# Patient Record
Sex: Female | Born: 2000
Health system: Southern US, Community
[De-identification: ages and names within clinical notes are randomized; demographics above are authoritative.]

## PROBLEM LIST (undated history)

## (undated) DIAGNOSIS — N80129 Deep endometriosis of ovary, unspecified ovary: Secondary | ICD-10-CM

## (undated) DIAGNOSIS — Z23 Encounter for immunization: Secondary | ICD-10-CM

## (undated) DIAGNOSIS — Z789 Other specified health status: Secondary | ICD-10-CM

## (undated) HISTORY — DX: Deep endometriosis of ovary, unspecified ovary: N80.129

## (undated) HISTORY — PX: NO PAST SURGERIES: SHX2092

## (undated) HISTORY — DX: Other specified health status: Z78.9

## (undated) HISTORY — DX: Encounter for immunization: Z23

---
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
## 2019-03-07 DIAGNOSIS — Z23 Encounter for immunization: Secondary | ICD-10-CM | POA: Diagnosis not present

## 2019-03-07 DIAGNOSIS — Z0000 Encounter for general adult medical examination without abnormal findings: Secondary | ICD-10-CM | POA: Diagnosis not present

## 2019-08-15 DIAGNOSIS — N946 Dysmenorrhea, unspecified: Secondary | ICD-10-CM | POA: Diagnosis not present

## 2019-08-19 DIAGNOSIS — N946 Dysmenorrhea, unspecified: Secondary | ICD-10-CM | POA: Diagnosis not present

## 2019-09-14 ENCOUNTER — Ambulatory Visit: Payer: Self-pay | Attending: Internal Medicine

## 2019-09-14 DIAGNOSIS — Z23 Encounter for immunization: Secondary | ICD-10-CM

## 2019-10-11 ENCOUNTER — Ambulatory Visit: Payer: Self-pay | Attending: Internal Medicine

## 2020-02-24 DIAGNOSIS — Z20822 Contact with and (suspected) exposure to covid-19: Secondary | ICD-10-CM | POA: Diagnosis not present

## 2020-03-02 DIAGNOSIS — Z20822 Contact with and (suspected) exposure to covid-19: Secondary | ICD-10-CM | POA: Diagnosis not present

## 2020-06-22 ENCOUNTER — Encounter: Payer: Self-pay | Admitting: Emergency Medicine

## 2020-06-22 ENCOUNTER — Emergency Department
Admission: EM | Admit: 2020-06-22 | Discharge: 2020-06-22 | Disposition: A | Discharge status: Home / Self Care | Payer: 59 | Attending: Emergency Medicine | Admitting: Emergency Medicine

## 2020-06-22 ENCOUNTER — Emergency Department: Payer: 59

## 2020-06-22 ENCOUNTER — Other Ambulatory Visit: Payer: Self-pay

## 2020-06-22 DIAGNOSIS — R102 Pelvic and perineal pain: Secondary | ICD-10-CM

## 2020-06-22 DIAGNOSIS — R110 Nausea: Secondary | ICD-10-CM | POA: Diagnosis not present

## 2020-06-22 DIAGNOSIS — N809 Endometriosis, unspecified: Secondary | ICD-10-CM | POA: Diagnosis not present

## 2020-06-22 DIAGNOSIS — N838 Other noninflammatory disorders of ovary, fallopian tube and broad ligament: Secondary | ICD-10-CM | POA: Diagnosis not present

## 2020-06-22 DIAGNOSIS — R1030 Lower abdominal pain, unspecified: Secondary | ICD-10-CM | POA: Diagnosis present

## 2020-06-22 DIAGNOSIS — N80129 Deep endometriosis of ovary, unspecified ovary: Secondary | ICD-10-CM

## 2020-06-22 LAB — URINALYSIS, COMPLETE (UACMP) WITH MICROSCOPIC
Bacteria, UA: NONE SEEN
Bilirubin Urine: NEGATIVE
Glucose, UA: NEGATIVE mg/dL
Hgb urine dipstick: NEGATIVE
Ketones, ur: 5 mg/dL — AB
Leukocytes,Ua: NEGATIVE
Nitrite: NEGATIVE
Protein, ur: NEGATIVE mg/dL
Specific Gravity, Urine: 1.023 (ref 1.005–1.030)
WBC, UA: NONE SEEN WBC/hpf (ref 0–5)
pH: 7 (ref 5.0–8.0)

## 2020-06-22 LAB — COMPREHENSIVE METABOLIC PANEL
ALT: 15 U/L (ref 0–44)
AST: 19 U/L (ref 15–41)
Albumin: 4.7 g/dL (ref 3.5–5.0)
Alkaline Phosphatase: 57 U/L (ref 38–126)
Anion gap: 8 (ref 5–15)
BUN: 13 mg/dL (ref 6–20)
CO2: 24 mmol/L (ref 22–32)
Calcium: 9.2 mg/dL (ref 8.9–10.3)
Chloride: 103 mmol/L (ref 98–111)
Creatinine, Ser: 0.68 mg/dL (ref 0.44–1.00)
GFR, Estimated: >60 mL/min (ref >60)
Glucose, Bld: 87 mg/dL (ref 70–99)
Potassium: 3.5 mmol/L (ref 3.5–5.1)
Sodium: 135 mmol/L (ref 135–145)
Total Bilirubin: 0.8 mg/dL (ref 0.3–1.2)
Total Protein: 8.7 g/dL — ABNORMAL HIGH (ref 6.5–8.1)

## 2020-06-22 LAB — LIPASE, BLOOD: Lipase: 27 U/L (ref 11–51)

## 2020-06-22 LAB — CBC
HCT: 39.3 % (ref 36.0–46.0)
Hemoglobin: 13.8 g/dL (ref 12.0–15.0)
MCH: 30.7 pg (ref 26.0–34.0)
MCHC: 35.1 g/dL (ref 30.0–36.0)
MCV: 87.5 fL (ref 80.0–100.0)
Platelets: 276 10*3/uL (ref 150–400)
RBC: 4.49 MIL/uL (ref 3.87–5.11)
RDW: 10.9 % — ABNORMAL LOW (ref 11.5–15.5)
WBC: 10.2 10*3/uL (ref 4.0–10.5)
nRBC: 0 % (ref 0.0–0.2)

## 2020-06-22 LAB — POC URINE PREG, ED: Preg Test, Ur: NEGATIVE

## 2020-06-22 MED ORDER — SODIUM CHLORIDE 0.9 % IV BOLUS
1000.0000 mL | Freq: Once | INTRAVENOUS | Status: AC
  Administered 2020-06-22: 1000 mL via INTRAVENOUS

## 2020-06-22 MED ORDER — HYDROCODONE-ACETAMINOPHEN 5-325 MG PO TABS
1.0000 | ORAL_TABLET | ORAL | 0 refills | Status: DC | PRN
Start: 2020-06-22 — End: 2020-11-06

## 2020-06-22 MED ORDER — IBUPROFEN 600 MG PO TABS: 600.0000 mg | ORAL_TABLET | Freq: Three times a day (TID) | ORAL | 0 refills | Status: DC | PRN

## 2020-06-22 MED ORDER — KETOROLAC TROMETHAMINE 30 MG/ML IJ SOLN
15.0000 mg | Freq: Once | INTRAMUSCULAR | Status: AC
  Administered 2020-06-22: 15 mg via INTRAVENOUS
  Filled 2020-06-22: qty 1

## 2020-06-22 MED ORDER — ONDANSETRON 4 MG PO TBDP
4.0000 mg | ORAL_TABLET | Freq: Three times a day (TID) | ORAL | 0 refills | Status: DC | PRN
Start: 2020-06-22 — End: 2020-11-06

## 2020-06-22 NOTE — ED Provider Notes (Signed)
[facility] EMERGENCY DEPARTMENT Provider Note   CSN: [id number] Arrival date & time: 06/22/20  1259     History Chief Complaint  Patient presents with  . Abdominal Pain    Leslie Russell is a 20 y.o. female.  HPI    20 yo F here with abdominal pain. Pt reports acute onset of severe lower abdominal pain this afternoon around 12. She had been doing well prior to this. Pain began fairly acutely 5-6/10 with severe, suprapubic pain. She had associated nausea. No vomiting. Pain felt similar to her menstrual cramps but more severe. She just finished her period last week. Reports h/o painful periods, no known h/o cysts. No h/o fibroids. She is not sexually active. Denies any urinary sx. No constipation or diarrhea. No fevers. No vaginal discharge. Pain slowly improving now, but was up to 10/10 in severity. No alleviating factors in particular.    History reviewed. No pertinent past medical history.  There are no problems to display for this patient.   History reviewed. No pertinent surgical history.   OB History   No obstetric history on file.     No family history on file.  Social History   Tobacco Use  . Smoking status: Never Smoker  . Smokeless tobacco: Never Used  Substance Use Topics  . Alcohol use: Not Currently  . Drug use: Not Currently    Home Medications Prior to Admission medications   Medication Sig Start Date End Date Taking? Authorizing Provider  HYDROcodone-acetaminophen (NORCO/VICODIN) 5-325 MG tablet Take 1 tablet by mouth every 4 (four) hours as needed for severe pain. 06/22/20 06/22/21 Yes Shaune Pollack, MD  ibuprofen (ADVIL) 600 MG tablet Take 1 tablet (600 mg total) by mouth every 8 (eight) hours as needed for moderate pain. 06/22/20  Yes Shaune Pollack, MD  ondansetron (ZOFRAN ODT) 4 MG disintegrating tablet Take 1 tablet (4 mg total) by mouth every 8 (eight) hours as needed for nausea or vomiting. 06/22/20  Yes Shaune Pollack, MD     Allergies    Patient has no known allergies.  Review of Systems   Review of Systems  Constitutional: Negative for fatigue and fever.  HENT: Negative for congestion and sore throat.   Eyes: Negative for visual disturbance.  Respiratory: Negative for cough and shortness of breath.   Cardiovascular: Negative for chest pain.  Gastrointestinal: Positive for abdominal pain. Negative for diarrhea, nausea and vomiting.  Genitourinary: Positive for pelvic pain. Negative for flank pain.  Musculoskeletal: Negative for back pain and neck pain.  Skin: Negative for rash and wound.  Neurological: Negative for weakness.  All other systems reviewed and are negative.   Physical Exam Updated Vital Signs BP 117/69   Pulse 77   Temp 98.6 F (37 C) (Oral)   Resp 14   Ht 5\' 5"  (1.651 m)   Wt 54.4 kg   SpO2 100%   BMI 19.97 kg/m   Physical Exam Vitals and nursing note reviewed.  Constitutional:      General: She is not in acute distress.    Appearance: She is well-developed.  HENT:     Head: Normocephalic and atraumatic.  Eyes:     Conjunctiva/sclera: Conjunctivae normal.  Cardiovascular:     Rate and Rhythm: Normal rate and regular rhythm.     Heart sounds: Normal heart sounds. No murmur heard. No friction rub.  Pulmonary:     Effort: Pulmonary effort is normal. No respiratory distress.     Breath sounds:  Normal breath sounds. No wheezing or rales.  Abdominal:     General: There is no distension.     Palpations: Abdomen is soft.     Tenderness: There is abdominal tenderness in the suprapubic area. There is no guarding or rebound.  Musculoskeletal:     Cervical back: Neck supple.  Skin:    General: Skin is warm.     Capillary Refill: Capillary refill takes less than 2 seconds.  Neurological:     Mental Status: She is alert and oriented to person, place, and time.     Motor: No abnormal muscle tone.     ED Results / Procedures / Treatments   Labs (all labs ordered are  listed, but only abnormal results are displayed) Labs Reviewed  COMPREHENSIVE METABOLIC PANEL - Abnormal; Notable for the following components:      Result Value   Total Protein 8.7 (*)    All other components within normal limits  CBC - Abnormal; Notable for the following components:   RDW 10.9 (*)    All other components within normal limits  URINALYSIS, COMPLETE (UACMP) WITH MICROSCOPIC - Abnormal; Notable for the following components:   Color, Urine YELLOW (*)    APPearance CLEAR (*)    Ketones, ur 5 (*)    All other components within normal limits  LIPASE, BLOOD  POC URINE PREG, ED    EKG None  Radiology [place] 4.4 cm nonvascular mass in the right ovary consistent with endometrioma, otherwise no evidence of torsion or other abnormality  Procedures Procedures   Medications Ordered in ED Medications  sodium chloride 0.9 % bolus 1,000 mL (0 mLs Intravenous Stopped 06/22/20 1709)  ketorolac (TORADOL) 30 MG/ML injection 15 mg (15 mg Intravenous Given 06/22/20 1525)    ED Course  I have reviewed the triage vital signs and the nursing notes.  Pertinent labs & imaging results that were available during my care of the patient were reviewed by me and considered in my medical decision making (see chart for details).    MDM Rules/Calculators/A&P                          20 year old female here with acute onset pelvic pain, now largely resolved.  Patient has a history of recurrent painful menses and clinically, story is concerning for possible endometriosis.  Interestingly, ultrasound does show an endometrioma in the right ovary.  No evidence of torsion.  Her pain is resolved.  Otherwise, she is not sexually active, do not suspect infection.  Urinalysis without signs of UTI.  No leukocytosis.  Electrolytes normal.  Urine pregnancy negative.  Will start her on NSAIDs and refer her for OB follow-up.  Given brief course of analgesia as needed.  Return precautions given.  Final Clinical  Impression(s) / ED Diagnoses Final diagnoses:  Pelvic pain  Endometrioma    Rx / DC Orders ED Discharge Orders         Ordered    ibuprofen (ADVIL) 600 MG tablet  Every 8 hours PRN        06/22/20 1856    HYDROcodone-acetaminophen (NORCO/VICODIN) 5-325 MG tablet  Every 4 hours PRN        06/22/20 1856    ondansetron (ZOFRAN ODT) 4 MG disintegrating tablet  Every 8 hours PRN        06/22/20 1856           Shaune Pollack, MD 06/22/20 1908

## 2020-06-22 NOTE — ED Triage Notes (Signed)
Pt here with c/o lower abd pain that began today, states she has very painful periods, had a period 2 weeks ago, tearful and breathing heavy in triage, states no chance of being pregnant, is a virgin. Pain is spasm in nature. NAD.

## 2020-07-13 ENCOUNTER — Ambulatory Visit (INDEPENDENT_AMBULATORY_CARE_PROVIDER_SITE_OTHER): Payer: 59 | Admitting: Obstetrics and Gynecology

## 2020-07-13 ENCOUNTER — Encounter: Payer: Self-pay | Admitting: Obstetrics and Gynecology

## 2020-07-13 ENCOUNTER — Other Ambulatory Visit: Payer: Self-pay

## 2020-07-13 ENCOUNTER — Other Ambulatory Visit (HOSPITAL_COMMUNITY)
Admission: RE | Admit: 2020-07-13 | Discharge: 2020-07-13 | Disposition: A | Discharge status: Home / Self Care | Payer: 59 | Source: Ambulatory Visit | Attending: Obstetrics and Gynecology | Admitting: Obstetrics and Gynecology

## 2020-07-13 VITALS — BP 100/74 | Ht 65.5 in | Wt 126.0 lb

## 2020-07-13 DIAGNOSIS — Z113 Encounter for screening for infections with a predominantly sexual mode of transmission: Secondary | ICD-10-CM | POA: Diagnosis not present

## 2020-07-13 DIAGNOSIS — N83201 Unspecified ovarian cyst, right side: Secondary | ICD-10-CM | POA: Diagnosis not present

## 2020-07-13 NOTE — Progress Notes (Signed)
Obstetrics & Gynecology Office Visit   Chief Complaint  Patient presents with  . Follow-up    ER ovary  ER follow up for ovarian cyst  History of Present Illness: 20 y.o. G0P0000 female who presents in follow up from an ER visit on 06/22/2020.  She had acute-onset abdominal pain in her lower abdomen.  She rates the pain score at that time as 10/10.  The pain did not radiate.  She describes the pain as tight, bad cramps.  Alleviating factors: none.  Aggravating factors: none.  Associated symptoms: denies. She specifically denies fevers, chills, nausea, vomiting, diarrhea. She has a period that comes monthly, last about 1 week. She has no intermenstrual bleeding.  She normally has painful periods to the point that she is only able to be in bed getting over the pain.  Usually the worst pain is during the first two days. After that the pain is more manageable.  This episode was different in that she usually doesn't have significant pain outside of her periods.  She is having no pain today.  She states that she doesn't have traditional symptoms. She is cautious about what she eats because this makes her pain worse.    Ultrasound report from 06/22/2020: FINDINGS: Uterus  Measurements: 7.3 x 2.8 x 4.5 cm = volume: 49 mL. No fibroids or other mass visualized.  Endometrium  Thickness: 5.1 mm.  No focal abnormality visualized.  Right ovary  Measurements: 5.3 x 4.1 x 4.8 cm = volume: 55 mL. There is a rounded mass closely associated with the right ovary that does not contain significant internal color Doppler flow. This measures 4.4 x 3.7 x 3.6 cm and contains low level internal echoes.  Left ovary  Measurements: 2.9 x 1.4 x 1.6 cm = volume: 3 mL. Normal appearance/no adnexal mass.  Pulsed Doppler evaluation of both ovaries demonstrates normal low-resistance arterial and venous waveforms.  Other findings  There is a small volume of pelvic free fluid.  IMPRESSION: 1. No  definite sonographic evidence for ovarian torsion. 2. There is a 4.4 cm nonvascular mass involving the right ovary with low level internal echoes. This is favored to represent an endometrioma. A follow-up ultrasound is recommended in 6-12 weeks.  Past Medical History:  Diagnosis Date  . No known health problems   . No pertinent past medical history     Past Surgical History:  Procedure Laterality Date  . NO PAST SURGERIES      Gynecologic History: Patient's last menstrual period was 07/09/2020 (exact date).  Obstetric History: G0P0000   Family History  Problem Relation Age of Onset  . Prostate cancer Maternal Grandfather   . Cancer - Colon Paternal Grandfather   . Endometriosis Neg Hx     Social History   Socioeconomic History  . Marital status: Single    Spouse name: Not on file  . Number of children: Not on file  . Years of education: Not on file  . Highest education level: Not on file  Occupational History  . Not on file  Tobacco Use  . Smoking status: Never Smoker  . Smokeless tobacco: Never Used  Vaping Use  . Vaping Use: Never used  Substance and Sexual Activity  . Alcohol use: Not Currently  . Drug use: Not Currently  . Sexual activity: Never    Birth control/protection: None  Other Topics Concern  . Not on file  Social History Narrative  . Not on file   Social Determinants of Health  Financial Resource Strain: Not on file  Food Insecurity: Not on file  Transportation Needs: Not on file  Physical Activity: Not on file  Stress: Not on file  Social Connections: Not on file  Intimate Partner Violence: Not on file   Allergies: No Known Allergies  Prior to Admission medications: Denies     Review of Systems  Constitutional: Negative.   HENT: Negative.   Eyes: Negative.   Respiratory: Negative.   Cardiovascular: Negative.   Gastrointestinal: Negative.   Genitourinary: Negative.   Musculoskeletal: Negative.   Skin: Negative.    Neurological: Negative.   Psychiatric/Behavioral: Negative.      Physical Exam BP 100/74   Ht 5' 5.5" (1.664 m)   Wt 126 lb (57.2 kg)   LMP 07/09/2020 (Exact Date)   BMI 20.65 kg/m  Patient's last menstrual period was 07/09/2020 (exact date). Physical Exam Constitutional:      General: She is not in acute distress.    Appearance: Normal appearance. She is well-developed.  HENT:     Head: Normocephalic and atraumatic.  Eyes:     General: No scleral icterus.    Conjunctiva/sclera: Conjunctivae normal.  Cardiovascular:     Rate and Rhythm: Normal rate and regular rhythm.     Heart sounds: No murmur heard. No friction rub. No gallop.   Pulmonary:     Effort: Pulmonary effort is normal. No respiratory distress.     Breath sounds: Normal breath sounds. No wheezing or rales.  Abdominal:     General: Bowel sounds are normal. There is no distension.     Palpations: Abdomen is soft. There is no mass.     Tenderness: There is no abdominal tenderness. There is no guarding or rebound.  Musculoskeletal:        General: Normal range of motion.     Cervical back: Normal range of motion and neck supple.  Neurological:     General: No focal deficit present.     Mental Status: She is alert and oriented to person, place, and time.     Cranial Nerves: No cranial nerve deficit.  Skin:    General: Skin is warm and dry.     Findings: No erythema.  Psychiatric:        Mood and Affect: Mood normal.        Behavior: Behavior normal.        Judgment: Judgment normal.     Female chaperone present for pelvic and breast  portions of the physical exam  Assessment: 20 y.o. No obstetric history on file. female here for  1. Right ovarian cyst   2. Screen for STD (sexually transmitted disease)      Plan: Problem List Items Addressed This Visit   None   Visit Diagnoses    Right ovarian cyst    -  Primary   Relevant Orders   US PELVIS (TRANSABDOMINAL ONLY)   Cervicovaginal ancillary only    Screen for STD (sexually transmitted disease)       Relevant Orders   Cervicovaginal ancillary only     She was present with her mother today.  We discussed that the finding on ultrasound appeared to be an endometrioma.  Another possibility would be a hemorrhagic ovarian cyst.  We discussed the implications of an endometrioma and potential management strategies, including: watchful waiting, symptomatic treatment with NSAIDs and possibly progesterone-based hormonal management.  She also indicated that she might like to try other, more natural remedies.  We discussed obtaining a  follow up ultrasound in a couple of months. If the cyst is still present with the same characteristics, then this would tell is that it is likely an endometrioma instead of a hemorrhagic ovarian cyst. She agreed to this plan and understands that she may contact me any time to discuss further or alter the plan.  All questions answered today.  She states that she is virginal and so we will perform a transabdominal ultrasound, though she agreed to STI screening today.   A total of 30 minutes were spent face-to-face with the patient as well as preparation, review, communication, and documentation during this encounter.    Thomasene Mohair, MD 07/13/2020 2:49 PM

## 2020-07-14 ENCOUNTER — Encounter: Payer: Self-pay | Admitting: Obstetrics and Gynecology

## 2020-07-17 LAB — CERVICOVAGINAL ANCILLARY ONLY
Chlamydia: NEGATIVE
Comment: NEGATIVE
Comment: NORMAL
Neisseria Gonorrhea: NEGATIVE

## 2020-08-08 ENCOUNTER — Other Ambulatory Visit: Payer: Self-pay | Admitting: Obstetrics and Gynecology

## 2020-08-08 DIAGNOSIS — N83201 Unspecified ovarian cyst, right side: Secondary | ICD-10-CM

## 2020-09-06 DIAGNOSIS — L259 Unspecified contact dermatitis, unspecified cause: Secondary | ICD-10-CM | POA: Diagnosis not present

## 2020-09-06 DIAGNOSIS — L0100 Impetigo, unspecified: Secondary | ICD-10-CM | POA: Diagnosis not present

## 2020-09-06 DIAGNOSIS — Z113 Encounter for screening for infections with a predominantly sexual mode of transmission: Secondary | ICD-10-CM | POA: Diagnosis not present

## 2020-09-06 DIAGNOSIS — Z0000 Encounter for general adult medical examination without abnormal findings: Secondary | ICD-10-CM | POA: Diagnosis not present

## 2020-09-12 ENCOUNTER — Ambulatory Visit
Admission: RE | Admit: 2020-09-12 | Discharge: 2020-09-12 | Disposition: A | Discharge status: Home / Self Care | Payer: 59 | Source: Ambulatory Visit | Attending: Obstetrics and Gynecology | Admitting: Obstetrics and Gynecology

## 2020-09-12 ENCOUNTER — Other Ambulatory Visit: Payer: Self-pay | Admitting: Obstetrics and Gynecology

## 2020-09-12 ENCOUNTER — Ambulatory Visit: Payer: 59 | Admitting: Obstetrics and Gynecology

## 2020-09-12 ENCOUNTER — Ambulatory Visit: Payer: 59

## 2020-09-12 ENCOUNTER — Other Ambulatory Visit: Payer: Self-pay

## 2020-09-12 DIAGNOSIS — N83201 Unspecified ovarian cyst, right side: Secondary | ICD-10-CM

## 2020-09-12 DIAGNOSIS — N83291 Other ovarian cyst, right side: Secondary | ICD-10-CM | POA: Diagnosis not present

## 2020-09-12 DIAGNOSIS — N838 Other noninflammatory disorders of ovary, fallopian tube and broad ligament: Secondary | ICD-10-CM | POA: Diagnosis not present

## 2020-09-17 ENCOUNTER — Ambulatory Visit: Payer: 59 | Admitting: Obstetrics and Gynecology

## 2020-09-17 ENCOUNTER — Other Ambulatory Visit: Payer: Self-pay

## 2020-09-17 ENCOUNTER — Encounter: Payer: Self-pay | Admitting: Obstetrics and Gynecology

## 2020-09-17 VITALS — BP 118/74 | Ht 65.0 in | Wt 130.0 lb

## 2020-09-17 DIAGNOSIS — N83201 Unspecified ovarian cyst, right side: Secondary | ICD-10-CM

## 2020-09-17 DIAGNOSIS — R102 Pelvic and perineal pain: Secondary | ICD-10-CM

## 2020-09-17 NOTE — Progress Notes (Signed)
Gynecology Ultrasound Follow Up  Chief Complaint:  Chief Complaint  Patient presents with  . Follow-up  Pelvic ultrasound on 09/12/2020 for pelvic pain and history of right ovarian cyst with concern for endometrioma.   History of Present Illness: Patient is a 20 y.o. female who presents today for ultrasound evaluation of the above .  Ultrasound demonstrates the following findings Adnexa: right ovary with complex right ovarian cyst, measurig 4.4 cm, consistent with likely endometrioma. Normal appearing left ovary Uterus: with endometrial stripe  4.9 mm Additional: normal ultrasound apart from right ovary.   She states that her pain is improving. She she is changing dietary habits and if she keeps up with that, the pain is not as bad.   Past Medical History:  Diagnosis Date  . No known health problems   . No pertinent past medical history     Past Surgical History:  Procedure Laterality Date  . NO PAST SURGERIES      Family History  Problem Relation Age of Onset  . Prostate cancer Maternal Grandfather   . Cancer - Colon Paternal Grandfather   . Endometriosis Neg Hx     Social History   Socioeconomic History  . Marital status: Single    Spouse name: Not on file  . Number of children: Not on file  . Years of education: Not on file  . Highest education level: Not on file  Occupational History  . Not on file  Tobacco Use  . Smoking status: Never Smoker  . Smokeless tobacco: Never Used  Vaping Use  . Vaping Use: Never used  Substance and Sexual Activity  . Alcohol use: Not Currently  . Drug use: Not Currently  . Sexual activity: Never    Birth control/protection: None  Other Topics Concern  . Not on file  Social History Narrative  . Not on file   Social Determinants of Health   Financial Resource Strain: Not on file  Food Insecurity: Not on file  Transportation Needs: Not on file  Physical Activity: Not on file  Stress: Not on file  Social Connections:  Not on file  Intimate Partner Violence: Not on file    No Known Allergies  Prior to Admission medications   Medication Sig Start Date End Date Taking? Authorizing Provider  HYDROcodone-acetaminophen (NORCO/VICODIN) 5-325 MG tablet Take 1 tablet by mouth every 4 (four) hours as needed for severe pain. 06/22/20 06/22/21  Shaune Pollack, MD  ibuprofen (ADVIL) 600 MG tablet Take 1 tablet (600 mg total) by mouth every 8 (eight) hours as needed for moderate pain. 06/22/20   Shaune Pollack, MD  ondansetron (ZOFRAN ODT) 4 MG disintegrating tablet Take 1 tablet (4 mg total) by mouth every 8 (eight) hours as needed for nausea or vomiting. Patient not taking: Reported on 07/13/2020 06/22/20   Shaune Pollack, MD    Physical Exam BP 118/74   Ht 5\' 5"  (1.651 m)   Wt 130 lb (59 kg)   BMI 21.63 kg/m    General: NAD HEENT: normocephalic, anicteric Pulmonary: No increased work of breathing Extremities: no edema, erythema, or tenderness Neurologic: Grossly intact, normal gait Psychiatric: mood appropriate, affect full  Imaging Results See report  Assessment: 20 y.o. G0P0000  1. Right ovarian cyst   2. Pelvic pain      Plan: Problem List Items Addressed This Visit   None   Visit Diagnoses    Right ovarian cyst    -  Primary   Relevant Orders  US PELVIS TRANSVAGINAL NON-OB (TV ONLY)   Pelvic pain       Relevant Orders   US PELVIS TRANSVAGINAL NON-OB (TV ONLY)     We discussed findings of likely endometrioma in setting of her symptoms. My recommendation is for removal with investigation of endometriosis, as it is likely the source of her symptoms. She will consider. We discussed the diagnosis, treatment, and natural history of the disease in great detail.  She will consider her options and let me know how she would like to proceed. At a minimum, I recommended that we follow up the cyst in 6 months to assess interval growth.  She voiced understanding.  We discussed that the cyst could grow,  new cysts could form, if we do not at least attempt suppression of endometriosis. We discussed pregnancy implications. We discussed small association of this cyst with clear cell cancer of the ovary.  All questions answered.   A total of 24 minutes were spent face-to-face with the patient as well as preparation, review, communication, and documentation during this encounter.    Thomasene Mohair, MD, [facility], [facility] 09/17/2020 1:50 PM

## 2020-10-02 ENCOUNTER — Telehealth: Payer: Self-pay

## 2020-10-02 NOTE — Telephone Encounter (Signed)
-----   Message from Conard Novak, MD sent at 09/18/2020  3:10 PM EDT ----- Regarding: Schedule surgery Surgery Booking Request Patient Full Name:  Leslie Russell  MRN: [id number]  DOB: Apr 23, 2001  Surgeon: Thomasene Mohair, MD  Requested Surgery Date and Time: TBD (after 10/18/2020) Primary Diagnosis AND Code:  1) Right ovarian cyst [N83.201] 2) Pelvic pain [R10.2] Secondary Diagnosis and Code:  Surgical Procedure: Robot assisted laparoscopic right ovarian cystectomy RNFA Requested?: Yes L&D Notification: No Admission Status: same day surgery Length of Surgery: 75 min Special Case Needs: Yes, [employer] forces technical officer Robot H&P: Yes Phone Interview???:  Yes Interpreter: No Medical Clearance:  No Special Scheduling Instructions: schedule for [employer] robot after 6/16.  Any known health/anesthesia issues, diabetes, sleep apnea, latex allergy, defibrillator/pacemaker?: No Acuity: P3   (P1 highest, P2 delay may cause harm, P3 low, elective gyn, P4 lowest)

## 2020-10-02 NOTE — Telephone Encounter (Signed)
Called patient to schedule Robot assisted laparoscopic right ovarian cystectomy w Britt Boozer 7/5  H&P 6/17 @ 2:10   Pre-admit phone call appointment to be requested - date and time will be included on H&P paper work. Also all appointments will be updated on pt MyChart. Explained that this appointment has a call window. Based on the time scheduled will indicate if the call will be received within a 4 hour window before 1:00 or after.  Advised that pt may also receive calls from the hospital pharmacy and pre-service center.  Confirmed pt has [employer] as primary insurance. No secondary insurance.

## 2020-10-17 NOTE — Telephone Encounter (Signed)
Patient called in to request that her H&P be changed to a different day.   H&P 6/29 @ 3:10

## 2020-10-19 ENCOUNTER — Encounter: Payer: 59 | Admitting: Obstetrics and Gynecology

## 2020-10-30 ENCOUNTER — Other Ambulatory Visit: Payer: Self-pay

## 2020-10-30 ENCOUNTER — Encounter
Admission: RE | Admit: 2020-10-30 | Discharge: 2020-10-30 | Disposition: A | Discharge status: Home / Self Care | Payer: 59 | Source: Ambulatory Visit | Attending: Obstetrics and Gynecology | Admitting: Obstetrics and Gynecology

## 2020-10-30 NOTE — Patient Instructions (Addendum)
Your procedure is scheduled on: November 06, 2020 TUESDAY Report to the Registration Desk on the 1st floor of the [facility] To find out your arrival time, please call [phone] between 1PM - 3PM on: Friday November 02, 2020  REMEMBER: Instructions that are not followed completely may result in serious medical risk, up to and including death; or upon the discretion of your surgeon and anesthesiologist your surgery may need to be rescheduled.  Do not eat food after midnight the night before surgery.  No gum chewing, lozengers or hard candies.  You may however, drink CLEAR liquids up to 2 hours before you are scheduled to arrive for your surgery. Do not drink anything within 2 hours of your scheduled arrival time.  Clear liquids include: - water  - apple juice without pulp - gatorade (not RED, PURPLE, OR BLUE) - black coffee or tea (Do NOT add milk or creamers to the coffee or tea) Do NOT drink anything that is not on this list.  Type 1 and Type 2 diabetics should only drink water.  In addition, your doctor has ordered for you to drink the provided  Ensure Pre-Surgery Clear Carbohydrate Drink  Drinking this carbohydrate drink up to two hours before surgery helps to reduce insulin resistance and improve patient outcomes. Please complete drinking 2 hours prior to scheduled arrival time.  TAKE THESE MEDICATIONS THE MORNING OF SURGERY WITH A SIP OF WATER: NONE   One week prior to surgery: Stop Anti-inflammatories (NSAIDS) such as Advil, Aleve, Ibuprofen, Motrin, Naproxen, Naprosyn and ASPIRIN OR Aspirin based products such as Excedrin, Goodys Powder, BC Powder. Stop ANY OVER THE COUNTER supplements until after surgery. You may however, continue to take Tylenol if needed for pain up until the day of surgery.  No Alcohol for 24 hours before or after surgery.  No Smoking including e-cigarettes for 24 hours prior to surgery.  No chewable tobacco products for at least 6 hours prior to surgery.   No nicotine patches on the day of surgery.  Do not use any "recreational" drugs for at least a week prior to your surgery.  Please be advised that the combination of cocaine and anesthesia may have negative outcomes, up to and including death. If you test positive for cocaine, your surgery will be cancelled.  On the morning of surgery brush your teeth with toothpaste and water, you may rinse your mouth with mouthwash if you wish. Do not swallow any toothpaste or mouthwash.  Do not wear jewelry, make-up, hairpins, clips or nail polish.  Do not wear lotions, powders, or perfumes OR DEODORANT  Do not shave body from the neck down 48 hours prior to surgery just in case you cut yourself which could leave a site for infection.  Also, freshly shaved skin may become irritated if using the CHG soap.  Contact lenses, hearing aids and dentures may not be worn into surgery.  Do not bring valuables to the hospital. [facility] is not responsible for any missing/lost belongings or valuables.   Use CHG Soap as directed on instruction sheet.  Notify your doctor if there is any change in your medical condition (cold, fever, infection).  Wear comfortable clothing (specific to your surgery type) to the hospital.  After surgery, you can help prevent lung complications by doing breathing exercises.  Take deep breaths and cough every 1-2 hours. Your doctor may order a device called an Incentive Spirometer to help you take deep breaths. When coughing or sneezing, hold a pillow  firmly against your incision with both hands. This is called "splinting." Doing this helps protect your incision. It also decreases belly discomfort.  If you are being discharged the day of surgery, you will not be allowed to drive home. You will need a responsible adult (18 years or older) to drive you home and stay with you that night.   If you are taking public transportation, you will need to have a responsible adult (18 years  or older) with you. Please confirm with your physician that it is acceptable to use public transportation.   Please call the Pre-admissions Testing Dept. at [phone] if you have any questions about these instructions.  Surgery Visitation Policy:  Patients undergoing a surgery or procedure may have one family member or support person with them as long as that person is not COVID-19 positive or experiencing its symptoms.  That person may remain in the waiting area during the procedure.  Inpatient Visitation:    Visiting hours are 7 a.m. to 8 p.m. Inpatients will be allowed two visitors daily. The visitors may change each day during the patient's stay. No visitors under the age of 70. Any visitor under the age of 30 must be accompanied by an adult. The visitor must pass COVID-19 screenings, use hand sanitizer when entering and exiting the patient's room and wear a mask at all times, including in the patient's room. Patients must also wear a mask when staff or their visitor are in the room. Masking is required regardless of vaccination status.

## 2020-10-31 ENCOUNTER — Ambulatory Visit (INDEPENDENT_AMBULATORY_CARE_PROVIDER_SITE_OTHER): Payer: 59 | Admitting: Obstetrics and Gynecology

## 2020-10-31 ENCOUNTER — Encounter: Payer: Self-pay | Admitting: Obstetrics and Gynecology

## 2020-10-31 ENCOUNTER — Other Ambulatory Visit
Admission: RE | Admit: 2020-10-31 | Discharge: 2020-10-31 | Disposition: A | Discharge status: Home / Self Care | Payer: 59 | Source: Ambulatory Visit | Attending: Obstetrics and Gynecology | Admitting: Obstetrics and Gynecology

## 2020-10-31 VITALS — BP 118/68 | Ht 65.0 in | Wt 131.0 lb

## 2020-10-31 DIAGNOSIS — Z01812 Encounter for preprocedural laboratory examination: Secondary | ICD-10-CM | POA: Insufficient documentation

## 2020-10-31 DIAGNOSIS — N83201 Unspecified ovarian cyst, right side: Secondary | ICD-10-CM

## 2020-10-31 DIAGNOSIS — R102 Pelvic and perineal pain: Secondary | ICD-10-CM

## 2020-10-31 LAB — TYPE AND SCREEN
ABO/RH(D): O POS
Antibody Screen: NEGATIVE

## 2020-10-31 NOTE — H&P (View-Only) (Signed)
Preoperative History and Physical  Leslie Russell is a 20 y.o. G0P0000 here for surgical management of chronic pelvic pain and a persistent right ovarian cyst, likely consistent with an endometrioma.   No significant preoperative concerns.  History of Present Illness: 20 y.o. G71P0000 female who presents with acute-onset abdominal pain in her lower abdomen.  She rates the pain score at that time as 10/10.  The pain did not radiate.  She describes the pain as tight, bad cramps.  Alleviating factors: none.  Aggravating factors: none.  Associated symptoms: denies. She specifically denies fevers, chills, nausea, vomiting, diarrhea. She has a period that comes monthly, last about 1 week. She has no intermenstrual bleeding.  She normally has painful periods to the point that she is only able to be in bed getting over the pain.  Usually the worst pain is during the first two days. After that the pain is more manageable.  This episode was different in that she usually doesn't have significant pain outside of her periods.  She is having no pain today. She states that she doesn't have traditional symptoms. She is cautious about what she eats because this makes her pain worse.     Ultrasound report from 06/22/2020: FINDINGS: Uterus   Measurements: 7.3 x 2.8 x 4.5 cm = volume: 49 mL. No fibroids or other mass visualized.   Endometrium   Thickness: 5.1 mm.  No focal abnormality visualized.   Right ovary   Measurements: 5.3 x 4.1 x 4.8 cm = volume: 55 mL. There is a rounded mass closely associated with the right ovary that does not contain significant internal color Doppler flow. This measures 4.4 x 3.7 x 3.6 cm and contains low level internal echoes.   Left ovary   Measurements: 2.9 x 1.4 x 1.6 cm = volume: 3 mL. Normal appearance/no adnexal mass.   Pulsed Doppler evaluation of both ovaries demonstrates normal low-resistance arterial and venous waveforms.   Other findings   There is a small volume  of pelvic free fluid.   IMPRESSION: 1. No definite sonographic evidence for ovarian torsion. 2. There is a 4.4 cm nonvascular mass involving the right ovary with low level internal echoes. This is favored to represent an endometrioma. A follow-up ultrasound is recommended in 6-12 weeks.  Follow up ultrasound on 09/12/20 (report): Ultrasound demonstrates the following findings Adnexa: right ovary with complex right ovarian cyst, measurig 4.4 cm, consistent with likely endometrioma. Normal appearing left ovary Uterus: with endometrial stripe  4.9 mm Additional: normal ultrasound apart from right ovary.   Proposed surgery: robot assisted right ovarian cystectomy, diagnostic laparoscopy  Past Medical History:  Diagnosis Date   No known health problems    No pertinent past medical history    Past Surgical History:  Procedure Laterality Date   NO PAST SURGERIES     OB History  Gravida Para Term Preterm AB Living  0 0 0 0 0 0  SAB IAB Ectopic Multiple Live Births  0 0 0 0 0  Patient denies any other pertinent gynecologic issues.   Current Outpatient Medications on File Prior to Visit  Medication Sig Dispense Refill   HYDROcodone-acetaminophen (NORCO/VICODIN) 5-325 MG tablet Take 1 tablet by mouth every 4 (four) hours as needed for severe pain. (Patient not taking: Reported on 10/23/2020) 6 tablet 0   ibuprofen (ADVIL) 200 MG tablet Take 400-600 mg by mouth every 6 (six) hours as needed for headache or moderate pain.     ibuprofen (ADVIL) 600  MG tablet Take 1 tablet (600 mg total) by mouth every 8 (eight) hours as needed for moderate pain. (Patient not taking: Reported on 10/23/2020) 20 tablet 0   ondansetron (ZOFRAN ODT) 4 MG disintegrating tablet Take 1 tablet (4 mg total) by mouth every 8 (eight) hours as needed for nausea or vomiting. (Patient not taking: No sig reported) 12 tablet 0   OVER THE COUNTER MEDICATION Take 2 tablets by mouth daily as needed (cramping). Cramp Aid otc  supplement     No current facility-administered medications on file prior to visit.   No Known Allergies  Social History:   reports that she has never smoked. She has never used smokeless tobacco. She reports previous alcohol use. She reports previous drug use.  Family History  Problem Relation Age of Onset   Prostate cancer Maternal Grandfather    Cancer - Colon Paternal Grandfather    Endometriosis Neg Hx     Review of Systems: Noncontributory  PHYSICAL EXAM: Blood pressure 118/68, height 5' 5" (1.651 m), weight 131 lb (59.4 kg), last menstrual period 10/23/2020. CONSTITUTIONAL: Well-developed, well-nourished female in no acute distress.  HENT:  Normocephalic, atraumatic, External right and left ear normal. Oropharynx is clear and moist EYES: Conjunctivae and EOM are normal. Pupils are equal, round, and reactive to light. No scleral icterus.  NECK: Normal range of motion, supple, no masses SKIN: Skin is warm and dry. No rash noted. Not diaphoretic. No erythema. No pallor. NEUROLGIC: Alert and oriented to person, place, and time. Normal reflexes, muscle tone coordination. No cranial nerve deficit noted. PSYCHIATRIC: Normal mood and affect. Normal behavior. Normal judgment and thought content. CARDIOVASCULAR: Normal heart rate noted, regular rhythm RESPIRATORY: Effort and breath sounds normal, no problems with respiration noted ABDOMEN: Soft, nontender, nondistended. PELVIC: Deferred MUSCULOSKELETAL: Normal range of motion. No edema and no tenderness. 2+ distal pulses.  Labs: No results found for this or any previous visit (from the past 336 hour(s)).  Imaging Studies: No results found.  Assessment:   ICD-10-CM   1. Right ovarian cyst  N83.201     2. Pelvic pain  R10.2        Plan: Patient will undergo surgical management with the above-proposed surgery.   The risks of surgery were discussed in detail with the patient including but not limited to: bleeding which may  require transfusion or reoperation; infection which may require antibiotics; injury to surrounding organs which may involve bowel, bladder, ureters ; need for additional procedures including laparoscopy or laparotomy; thromboembolic phenomenon, surgical site problems and other postoperative/anesthesia complications. Likelihood of success in alleviating the patient's condition was discussed. Routine postoperative instructions will be reviewed with the patient and her family in detail after surgery.  The patient concurred with the proposed plan, giving informed written consent for the surgery.  Preoperative prophylactic antibiotics, as indicated, and SCDs ordered on call to the OR.    Kati Riggenbach, MD 10/31/2020 3:29 PM    

## 2020-10-31 NOTE — Progress Notes (Signed)
Preoperative History and Physical  Leslie Russell is a 20 y.o. G0P0000 here for surgical management of chronic pelvic pain and a persistent right ovarian cyst, likely consistent with an endometrioma.   No significant preoperative concerns.  History of Present Illness: 20 y.o. G71P0000 female who presents with acute-onset abdominal pain in her lower abdomen.  She rates the pain score at that time as 10/10.  The pain did not radiate.  She describes the pain as tight, bad cramps.  Alleviating factors: none.  Aggravating factors: none.  Associated symptoms: denies. She specifically denies fevers, chills, nausea, vomiting, diarrhea. She has a period that comes monthly, last about 1 week. She has no intermenstrual bleeding.  She normally has painful periods to the point that she is only able to be in bed getting over the pain.  Usually the worst pain is during the first two days. After that the pain is more manageable.  This episode was different in that she usually doesn't have significant pain outside of her periods.  She is having no pain today. She states that she doesn't have traditional symptoms. She is cautious about what she eats because this makes her pain worse.     Ultrasound report from 06/22/2020: FINDINGS: Uterus   Measurements: 7.3 x 2.8 x 4.5 cm = volume: 49 mL. No fibroids or other mass visualized.   Endometrium   Thickness: 5.1 mm.  No focal abnormality visualized.   Right ovary   Measurements: 5.3 x 4.1 x 4.8 cm = volume: 55 mL. There is a rounded mass closely associated with the right ovary that does not contain significant internal color Doppler flow. This measures 4.4 x 3.7 x 3.6 cm and contains low level internal echoes.   Left ovary   Measurements: 2.9 x 1.4 x 1.6 cm = volume: 3 mL. Normal appearance/no adnexal mass.   Pulsed Doppler evaluation of both ovaries demonstrates normal low-resistance arterial and venous waveforms.   Other findings   There is a small volume  of pelvic free fluid.   IMPRESSION: 1. No definite sonographic evidence for ovarian torsion. 2. There is a 4.4 cm nonvascular mass involving the right ovary with low level internal echoes. This is favored to represent an endometrioma. A follow-up ultrasound is recommended in 6-12 weeks.  Follow up ultrasound on 09/12/20 (report): Ultrasound demonstrates the following findings Adnexa: right ovary with complex right ovarian cyst, measurig 4.4 cm, consistent with likely endometrioma. Normal appearing left ovary Uterus: with endometrial stripe  4.9 mm Additional: normal ultrasound apart from right ovary.   Proposed surgery: robot assisted right ovarian cystectomy, diagnostic laparoscopy  Past Medical History:  Diagnosis Date   No known health problems    No pertinent past medical history    Past Surgical History:  Procedure Laterality Date   NO PAST SURGERIES     OB History  Gravida Para Term Preterm AB Living  0 0 0 0 0 0  SAB IAB Ectopic Multiple Live Births  0 0 0 0 0  Patient denies any other pertinent gynecologic issues.   Current Outpatient Medications on File Prior to Visit  Medication Sig Dispense Refill   HYDROcodone-acetaminophen (NORCO/VICODIN) 5-325 MG tablet Take 1 tablet by mouth every 4 (four) hours as needed for severe pain. (Patient not taking: Reported on 10/23/2020) 6 tablet 0   ibuprofen (ADVIL) 200 MG tablet Take 400-600 mg by mouth every 6 (six) hours as needed for headache or moderate pain.     ibuprofen (ADVIL) 600  MG tablet Take 1 tablet (600 mg total) by mouth every 8 (eight) hours as needed for moderate pain. (Patient not taking: Reported on 10/23/2020) 20 tablet 0   ondansetron (ZOFRAN ODT) 4 MG disintegrating tablet Take 1 tablet (4 mg total) by mouth every 8 (eight) hours as needed for nausea or vomiting. (Patient not taking: No sig reported) 12 tablet 0   OVER THE COUNTER MEDICATION Take 2 tablets by mouth daily as needed (cramping). Cramp Aid otc  supplement     No current facility-administered medications on file prior to visit.   No Known Allergies  Social History:   reports that she has never smoked. She has never used smokeless tobacco. She reports previous alcohol use. She reports previous drug use.  Family History  Problem Relation Age of Onset   Prostate cancer Maternal Grandfather    Cancer - Colon Paternal Grandfather    Endometriosis Neg Hx     Review of Systems: Noncontributory  PHYSICAL EXAM: Blood pressure 118/68, height 5\' 5"  (1.651 m), weight 131 lb (59.4 kg), last menstrual period 10/23/2020. CONSTITUTIONAL: Well-developed, well-nourished female in no acute distress.  HENT:  Normocephalic, atraumatic, External right and left ear normal. Oropharynx is clear and moist EYES: Conjunctivae and EOM are normal. Pupils are equal, round, and reactive to light. No scleral icterus.  NECK: Normal range of motion, supple, no masses SKIN: Skin is warm and dry. No rash noted. Not diaphoretic. No erythema. No pallor. NEUROLGIC: Alert and oriented to person, place, and time. Normal reflexes, muscle tone coordination. No cranial nerve deficit noted. PSYCHIATRIC: Normal mood and affect. Normal behavior. Normal judgment and thought content. CARDIOVASCULAR: Normal heart rate noted, regular rhythm RESPIRATORY: Effort and breath sounds normal, no problems with respiration noted ABDOMEN: Soft, nontender, nondistended. PELVIC: Deferred MUSCULOSKELETAL: Normal range of motion. No edema and no tenderness. 2+ distal pulses.  Labs: No results found for this or any previous visit (from the past 336 hour(s)).  Imaging Studies: No results found.  Assessment:   ICD-10-CM   1. Right ovarian cyst  N83.201     2. Pelvic pain  R10.2        Plan: Patient will undergo surgical management with the above-proposed surgery.   The risks of surgery were discussed in detail with the patient including but not limited to: bleeding which may  require transfusion or reoperation; infection which may require antibiotics; injury to surrounding organs which may involve bowel, bladder, ureters ; need for additional procedures including laparoscopy or laparotomy; thromboembolic phenomenon, surgical site problems and other postoperative/anesthesia complications. Likelihood of success in alleviating the patient's condition was discussed. Routine postoperative instructions will be reviewed with the patient and her family in detail after surgery.  The patient concurred with the proposed plan, giving informed written consent for the surgery.  Preoperative prophylactic antibiotics, as indicated, and SCDs ordered on call to the OR.    10/25/2020, MD 10/31/2020 3:29 PM

## 2020-11-05 MED ORDER — LACTATED RINGERS IV SOLN: INTRAVENOUS | Status: DC

## 2020-11-05 MED ORDER — POVIDONE-IODINE 10 % EX SWAB
2.0000 "application " | Freq: Once | CUTANEOUS | Status: AC
  Administered 2020-11-06: 2 via TOPICAL

## 2020-11-05 MED ORDER — FAMOTIDINE 20 MG PO TABS: 20.0000 mg | ORAL_TABLET | Freq: Once | ORAL | Status: DC

## 2020-11-06 ENCOUNTER — Ambulatory Visit: Payer: 59 | Admitting: Urgent Care

## 2020-11-06 ENCOUNTER — Other Ambulatory Visit: Payer: Self-pay

## 2020-11-06 ENCOUNTER — Encounter
Admission: RE | Disposition: A | Discharge status: Home / Self Care | Payer: Self-pay | Source: Home / Self Care | Attending: Obstetrics and Gynecology

## 2020-11-06 ENCOUNTER — Encounter: Payer: Self-pay | Admitting: Obstetrics and Gynecology

## 2020-11-06 ENCOUNTER — Ambulatory Visit
Admission: RE | Admit: 2020-11-06 | Discharge: 2020-11-06 | Disposition: A | Discharge status: Home / Self Care | Payer: 59 | Attending: Obstetrics and Gynecology | Admitting: Obstetrics and Gynecology

## 2020-11-06 ENCOUNTER — Ambulatory Visit: Payer: 59 | Admitting: Certified Registered"

## 2020-11-06 DIAGNOSIS — N801 Endometriosis of ovary: Secondary | ICD-10-CM | POA: Insufficient documentation

## 2020-11-06 DIAGNOSIS — R102 Pelvic and perineal pain: Secondary | ICD-10-CM | POA: Diagnosis present

## 2020-11-06 DIAGNOSIS — N83201 Unspecified ovarian cyst, right side: Secondary | ICD-10-CM | POA: Diagnosis not present

## 2020-11-06 DIAGNOSIS — N8311 Corpus luteum cyst of right ovary: Secondary | ICD-10-CM | POA: Diagnosis not present

## 2020-11-06 DIAGNOSIS — N80129 Deep endometriosis of ovary, unspecified ovary: Secondary | ICD-10-CM | POA: Diagnosis present

## 2020-11-06 HISTORY — PX: ROBOTIC ASSISTED LAPAROSCOPIC OVARIAN CYSTECTOMY: SHX6081

## 2020-11-06 LAB — ABO/RH: ABO/RH(D): O POS

## 2020-11-06 LAB — POCT PREGNANCY, URINE: Preg Test, Ur: NEGATIVE

## 2020-11-06 SURGERY — EXCISION, CYST, OVARY, ROBOT-ASSISTED, LAPAROSCOPIC
Anesthesia: General | Laterality: Right

## 2020-11-06 MED ORDER — DEXAMETHASONE SODIUM PHOSPHATE 10 MG/ML IJ SOLN
INTRAMUSCULAR | Status: DC | PRN
  Administered 2020-11-06: 10 mg via INTRAVENOUS

## 2020-11-06 MED ORDER — IBUPROFEN 800 MG PO TABS: 800.0000 mg | ORAL_TABLET | Freq: Three times a day (TID) | ORAL | 0 refills | Status: DC | PRN

## 2020-11-06 MED ORDER — ROCURONIUM BROMIDE 100 MG/10ML IV SOLN
INTRAVENOUS | Status: DC | PRN
  Administered 2020-11-06: 25 mg via INTRAVENOUS
  Administered 2020-11-06: 45 mg via INTRAVENOUS

## 2020-11-06 MED ORDER — HEMOSTATIC AGENTS (NO CHARGE) OPTIME
TOPICAL | Status: DC | PRN
  Administered 2020-11-06: 1 via TOPICAL

## 2020-11-06 MED ORDER — ORAL CARE MOUTH RINSE: 15.0000 mL | Freq: Once | OROMUCOSAL | Status: AC

## 2020-11-06 MED ORDER — ONDANSETRON HCL 4 MG/2ML IJ SOLN
INTRAMUSCULAR | Status: DC | PRN
  Administered 2020-11-06: 4 mg via INTRAVENOUS

## 2020-11-06 MED ORDER — HYDROCODONE-ACETAMINOPHEN 5-325 MG PO TABS: 1.0000 | ORAL_TABLET | Freq: Four times a day (QID) | ORAL | 0 refills | Status: DC | PRN

## 2020-11-06 MED ORDER — LIDOCAINE HCL (CARDIAC) PF 100 MG/5ML IV SOSY
PREFILLED_SYRINGE | INTRAVENOUS | Status: DC | PRN
  Administered 2020-11-06: 60 mg via INTRAVENOUS

## 2020-11-06 MED ORDER — FENTANYL CITRATE (PF) 100 MCG/2ML IJ SOLN
INTRAMUSCULAR | Status: AC
  Filled 2020-11-06: qty 2

## 2020-11-06 MED ORDER — ONDANSETRON 4 MG PO TBDP: 4.0000 mg | ORAL_TABLET | Freq: Three times a day (TID) | ORAL | 0 refills | Status: DC | PRN

## 2020-11-06 MED ORDER — ACETAMINOPHEN 10 MG/ML IV SOLN
INTRAVENOUS | Status: DC | PRN
  Administered 2020-11-06: 1000 mg via INTRAVENOUS

## 2020-11-06 MED ORDER — PROPOFOL 500 MG/50ML IV EMUL
INTRAVENOUS | Status: AC
  Filled 2020-11-06: qty 50

## 2020-11-06 MED ORDER — CHLORHEXIDINE GLUCONATE 0.12 % MT SOLN
OROMUCOSAL | Status: AC
  Administered 2020-11-06: 15 mL via OROMUCOSAL
  Filled 2020-11-06: qty 15

## 2020-11-06 MED ORDER — CHLORHEXIDINE GLUCONATE 0.12 % MT SOLN: 15.0000 mL | Freq: Once | OROMUCOSAL | Status: AC

## 2020-11-06 MED ORDER — MIDAZOLAM HCL 2 MG/2ML IJ SOLN
INTRAMUSCULAR | Status: AC
  Filled 2020-11-06: qty 2

## 2020-11-06 MED ORDER — BUPIVACAINE HCL (PF) 0.5 % IJ SOLN
INTRAMUSCULAR | Status: AC
  Filled 2020-11-06: qty 30

## 2020-11-06 MED ORDER — LACTATED RINGERS IV SOLN: INTRAVENOUS | Status: DC | PRN

## 2020-11-06 MED ORDER — MIDAZOLAM HCL 2 MG/2ML IJ SOLN
INTRAMUSCULAR | Status: DC | PRN
  Administered 2020-11-06: 2 mg via INTRAVENOUS

## 2020-11-06 MED ORDER — OXYCODONE HCL 5 MG PO TABS
5.0000 mg | ORAL_TABLET | ORAL | Status: AC
  Administered 2020-11-06: 5 mg via ORAL

## 2020-11-06 MED ORDER — FENTANYL CITRATE (PF) 100 MCG/2ML IJ SOLN
25.0000 ug | INTRAMUSCULAR | Status: DC | PRN
  Administered 2020-11-06 (×4): 25 ug via INTRAVENOUS

## 2020-11-06 MED ORDER — ONDANSETRON HCL 4 MG/2ML IJ SOLN: 4.0000 mg | Freq: Once | INTRAMUSCULAR | Status: DC | PRN

## 2020-11-06 MED ORDER — 0.9 % SODIUM CHLORIDE (POUR BTL) OPTIME
TOPICAL | Status: DC | PRN
  Administered 2020-11-06: 200 mL

## 2020-11-06 MED ORDER — OXYCODONE HCL 5 MG PO TABS
ORAL_TABLET | ORAL | Status: AC
  Filled 2020-11-06: qty 1

## 2020-11-06 MED ORDER — ACETAMINOPHEN 10 MG/ML IV SOLN
INTRAVENOUS | Status: AC
  Filled 2020-11-06: qty 100

## 2020-11-06 MED ORDER — SUGAMMADEX SODIUM 200 MG/2ML IV SOLN
INTRAVENOUS | Status: DC | PRN
  Administered 2020-11-06: 200 mg via INTRAVENOUS

## 2020-11-06 MED ORDER — FENTANYL CITRATE (PF) 100 MCG/2ML IJ SOLN
INTRAMUSCULAR | Status: DC | PRN
  Administered 2020-11-06 (×2): 50 ug via INTRAVENOUS

## 2020-11-06 MED ORDER — SODIUM CHLORIDE FLUSH 0.9 % IV SOLN
INTRAVENOUS | Status: AC
  Filled 2020-11-06: qty 10

## 2020-11-06 MED ORDER — FENTANYL CITRATE (PF) 100 MCG/2ML IJ SOLN
INTRAMUSCULAR | Status: AC
  Administered 2020-11-06: 25 ug via INTRAVENOUS
  Filled 2020-11-06: qty 2

## 2020-11-06 SURGICAL SUPPLY — 85 items
ADH SKN CLS APL DERMABOND .7 (GAUZE/BANDAGES/DRESSINGS) ×1
APL PRP STRL LF DISP 70% ISPRP (MISCELLANEOUS) ×1
BACTOSHIELD CHG 4% 4OZ (MISCELLANEOUS) ×1
BAG DRN RND TRDRP ANRFLXCHMBR (UROLOGICAL SUPPLIES) ×1
BAG RETRIEVAL 10 (BASKET) ×1
BAG URINE DRAIN 2000ML AR STRL (UROLOGICAL SUPPLIES) ×2 IMPLANT
BLADE SURG SZ10 CARB STEEL (BLADE) ×2 IMPLANT
BLADE SURG SZ11 CARB STEEL (BLADE) ×2 IMPLANT
CANISTER SUCT 1200ML W/VALVE (MISCELLANEOUS) ×2 IMPLANT
CANNULA REDUC XI 12-8 STAPL (CANNULA) ×1
CANNULA REDUCER 12-8 DVNC XI (CANNULA) ×1 IMPLANT
CATH FOLEY 2WAY  5CC 16FR (CATHETERS) ×1
CATH FOLEY 2WAY 5CC 16FR (CATHETERS) ×1
CATH URTH 16FR FL 2W BLN LF (CATHETERS) ×1 IMPLANT
CHLORAPREP W/TINT 26 (MISCELLANEOUS) ×2 IMPLANT
CORD BIP STRL DISP 12FT (MISCELLANEOUS) ×2 IMPLANT
COVER BACK TABLE REUSABLE LG (DRAPES) ×2 IMPLANT
COVER MAYO STAND REUSABLE (DRAPES) ×2 IMPLANT
COVER TIP SHEARS 8 DVNC (MISCELLANEOUS) ×1 IMPLANT
COVER TIP SHEARS 8MM DA VINCI (MISCELLANEOUS) ×1
DEFOGGER SCOPE WARMER CLEARIFY (MISCELLANEOUS) ×2 IMPLANT
DERMABOND ADVANCED (GAUZE/BANDAGES/DRESSINGS) ×1
DERMABOND ADVANCED .7 DNX12 (GAUZE/BANDAGES/DRESSINGS) ×1 IMPLANT
DRAPE 3/4 80X56 (DRAPES) ×4 IMPLANT
DRAPE ARM DVNC X/XI (DISPOSABLE) ×4 IMPLANT
DRAPE DA VINCI XI ARM (DISPOSABLE) ×4
DRAPE LEGGINS SURG 28X43 STRL (DRAPES) ×2 IMPLANT
DRAPE UNDER BUTTOCK W/FLU (DRAPES) ×2 IMPLANT
DRESSING SURGICEL FIBRLLR 1X2 (HEMOSTASIS) ×1 IMPLANT
DRSG SURGICEL FIBRILLAR 1X2 (HEMOSTASIS) ×2
ELECT REM PT RETURN 9FT ADLT (ELECTROSURGICAL) ×2
ELECTRODE REM PT RTRN 9FT ADLT (ELECTROSURGICAL) ×1 IMPLANT
FILTER LAP SMOKE EVAC STRL (MISCELLANEOUS) ×2 IMPLANT
GAUZE 4X4 16PLY ~~LOC~~+RFID DBL (SPONGE) ×2 IMPLANT
GLOVE SURG ENC MOIS LTX SZ7 (GLOVE) ×8 IMPLANT
GLOVE SURG UNDER POLY LF SZ7.5 (GLOVE) ×8 IMPLANT
GOWN STRL REUS W/ TWL LRG LVL3 (GOWN DISPOSABLE) ×6 IMPLANT
GOWN STRL REUS W/TWL LRG LVL3 (GOWN DISPOSABLE) ×12
GRASPER SUT TROCAR 14GX15 (MISCELLANEOUS) ×2 IMPLANT
IRRIGATION STRYKERFLOW (MISCELLANEOUS) ×1 IMPLANT
IRRIGATOR STRYKERFLOW (MISCELLANEOUS) ×2
IV NS 1000ML (IV SOLUTION) ×4
IV NS 1000ML BAXH (IV SOLUTION) ×2 IMPLANT
KIT PINK PAD W/HEAD ARE REST (MISCELLANEOUS) ×2
KIT PINK PAD W/HEAD ARM REST (MISCELLANEOUS) ×1 IMPLANT
KIT TURNOVER CYSTO (KITS) ×2 IMPLANT
LABEL OR SOLS (LABEL) ×2 IMPLANT
MANIFOLD NEPTUNE II (INSTRUMENTS) ×2 IMPLANT
MANIPULATOR VCARE LG CRV RETR (MISCELLANEOUS) IMPLANT
MANIPULATOR VCARE STD CRV RETR (MISCELLANEOUS) IMPLANT
NEEDLE HYPO 22GX1.5 SAFETY (NEEDLE) ×2 IMPLANT
NS IRRIG 1000ML POUR BTL (IV SOLUTION) ×4 IMPLANT
OBTURATOR OPTICAL STANDARD 8MM (TROCAR) ×1
OBTURATOR OPTICAL STND 8 DVNC (TROCAR) ×1
OBTURATOR OPTICALSTD 8 DVNC (TROCAR) ×1 IMPLANT
OCCLUDER COLPOPNEUMO (BALLOONS) IMPLANT
PACK LAP CHOLECYSTECTOMY (MISCELLANEOUS) ×2 IMPLANT
PAD ARMBOARD 7.5X6 YLW CONV (MISCELLANEOUS) ×2 IMPLANT
PAD OB MATERNITY 4.3X12.25 (PERSONAL CARE ITEMS) ×2 IMPLANT
PAD PREP 24X41 OB/GYN DISP (PERSONAL CARE ITEMS) ×2 IMPLANT
SCISSORS METZENBAUM CVD 33 (INSTRUMENTS) ×2 IMPLANT
SCRUB CHG 4% DYNA-HEX 4OZ (MISCELLANEOUS) ×1 IMPLANT
SOLUTION ELECTROLUBE (MISCELLANEOUS) ×2 IMPLANT
SPONGE T-LAP 18X18 ~~LOC~~+RFID (SPONGE) ×2 IMPLANT
STAPLER CANNULA SEAL DVNC XI (STAPLE) ×1 IMPLANT
STAPLER CANNULA SEAL XI (STAPLE) ×1
SURGILUBE 2OZ TUBE FLIPTOP (MISCELLANEOUS) ×2 IMPLANT
SUT DVC VLOC 180 0 12IN GS21 (SUTURE)
SUT VIC AB 0 CT2 27 (SUTURE) IMPLANT
SUT VIC AB 1 CT1 36 (SUTURE) IMPLANT
SUT VIC AB 2-0 CT1 27 (SUTURE)
SUT VIC AB 2-0 CT1 TAPERPNT 27 (SUTURE) IMPLANT
SUT VIC AB 4-0 SH 27 (SUTURE) ×2
SUT VIC AB 4-0 SH 27XANBCTRL (SUTURE) ×1 IMPLANT
SUT VICRYL 0 AB UR-6 (SUTURE) ×2 IMPLANT
SUTURE DVC VLC 180 0 12IN GS21 (SUTURE) IMPLANT
SYR 10ML LL (SYRINGE) ×2 IMPLANT
SYR 50ML LL SCALE MARK (SYRINGE) ×2 IMPLANT
SYS BAG RETRIEVAL 10MM (BASKET) ×1
SYSTEM BAG RETRIEVAL 10MM (BASKET) ×1 IMPLANT
TROCAR 12M 150ML BLUNT (TROCAR) IMPLANT
TROCAR ENDO BLADELESS 11MM (ENDOMECHANICALS) ×2 IMPLANT
TROCAR XCEL 12X100 BLDLESS (ENDOMECHANICALS) IMPLANT
TROCAR XCEL NON-BLD 5MMX100MML (ENDOMECHANICALS) ×2 IMPLANT
TUBING EVAC SMOKE HEATED PNEUM (TUBING) ×2 IMPLANT

## 2020-11-06 NOTE — Anesthesia Preprocedure Evaluation (Signed)
Anesthesia Evaluation  Patient identified by MRN, date of birth, ID band Patient awake    Reviewed: Allergy & Precautions, H&P , NPO status , Patient's Chart, lab work & pertinent test results, reviewed documented beta blocker date and time   Airway Mallampati: II  TM Distance: >3 FB Neck ROM: full    Dental  (+) Teeth Intact   Pulmonary neg pulmonary ROS,    Pulmonary exam normal        Cardiovascular Exercise Tolerance: Good negative cardio ROS Normal cardiovascular exam Rhythm:regular Rate:Normal     Neuro/Psych negative neurological ROS  negative psych ROS   GI/Hepatic negative GI ROS, Neg liver ROS,   Endo/Other  negative endocrine ROS  Renal/GU negative Renal ROS  negative genitourinary   Musculoskeletal   Abdominal   Peds  Hematology negative hematology ROS (+)   Anesthesia Other Findings Past Medical History: No date: No known health problems No date: No pertinent past medical history Past Surgical History: No date: NO PAST SURGERIES BMI    Body Mass Index: 21.79 kg/m     Reproductive/Obstetrics negative OB ROS                             Anesthesia Physical Anesthesia Plan  ASA: 2  Anesthesia Plan: General ETT   Post-op Pain Management:    Induction:   PONV Risk Score and Plan: 4 or greater  Airway Management Planned:   Additional Equipment:   Intra-op Plan:   Post-operative Plan:   Informed Consent: I have reviewed the patients History and Physical, chart, labs and discussed the procedure including the risks, benefits and alternatives for the proposed anesthesia with the patient or authorized representative who has indicated his/her understanding and acceptance.     Dental Advisory Given  Plan Discussed with: CRNA  Anesthesia Plan Comments:         Anesthesia Quick Evaluation

## 2020-11-06 NOTE — Op Note (Signed)
Operative Note    Name: Cala Bradford  Date of Service: 11/06/2020  DOB: [birth year]  MRN: [id number]   Pre-Operative Diagnosis:  1) Right ovarian cyst [N83.201] 2) Pelvic pain [R10.2]  Post-Operative Diagnosis:  1) Right ovarian cyst [N83.201] 2) Pelvic pain [R10.2]  Procedures:  1) Robot assisted laparoscopic right ovarian cystectomy 2) Robot assisted diagnostic laparoscopy  Primary Surgeon: Thomasene Mohair, MD   EBL: 160 mL   IVF: 1,000 mL   Urine output: 600 mL clear urine at end of case  Specimens: right ovarian cyst and cyst wall  Drains: none  Complications: None   Disposition: PACU   Condition: Stable   Findings:  1) Enlarged right ovary with two separate cysts. The first cyst with chocolate brown fluid contents. The second appeared hemorrhagic 2) Bowel adherent to left pelvic sidewall 3) No obvious endometriosis implants elsewhere in pelvis  Procedure Summary:  The patient was taken to the operating room where general anesthesia was administered and found to be adequate. She was placed in the dorsal supine lithotomy position in [place] stirrups and prepped and draped in usual sterile fashion. After a timeout was called an indwelling catheter was placed in her bladder.    Attention was turned to the abdomen where after injection of local anesthetic, an 8 mm infraumbilical incision was made with the scalpel. Entry into the abdomen was obtained via Optiview trocar technique (a blunt entry technique with camera visualization through the obturator upon entry). Verification of entry into the abdomen was obtained using opening pressures. The abdomen was insufflated with CO2. The camera was introduced through the trocar with verification of atraumatic entry.  Right and left abdominal entry sites were created after injection of local anesthetic about 8 cm away from the umbilical port in accordance with the Intuitive manufacturer's recommendations.  The port sites were 8 mm.   The intuitive trochars were introduced under intra-abdominal camera visualization without difficulty.  A fourth trocar site was created about 8 cm away from the right lateral port site, greater than 2 cm superior to the anterior superior iliac spine.  The umbilical 8 mm port site was exchanged for a 12 mm port site without difficulty.  The robot was docked from the patient's left side and the 8 mm 30 degree camera was placed through port two.  The bipolar [place] forceps were placed through port 1.  The monopolar scissors were placed through port 3.  The forced bipolar forceps were placed through port 4.  The right ovary was visualized and was enlarged.  In trying to manipulate the ovary to rotated into full visual access the cyst was punctured and spillage of chocolate appearing fluid occurred from the cyst.  Using this opening the cyst wall was separated from the ovarian stroma.  There appeared to be another separate cyst within the ovary.  Using the opening already created it became apparent that the cyst were adjacent without intervening ovarian stroma.  The second cyst was dissected away from the ovarian stroma.  This was removed and placed in the anterior cul-de-sac.  A 10 mm Endo pouch was introduced through the port 2 after port hopping the camera and support 3.  The cyst and cyst wall were placed in the Endopouch.  The Endopouch was retracted and the camera was port hopped back to port 2.  A survey of the remaining pelvic structures was undertaken with the above-noted findings.  Of note, the left ovary appeared normal along with the left fallopian tube.  No endometriosis implants were identified.  Small oozing was noted from the base of the right ovarian stroma where the cyst was removed.  This was cauterized lightly and fibrillar was placed at the base.  With apparent hemostasis the robotic instruments were removed after irrigation of the cul-de-sac.  The instruments were removed and the robot was  undocked.  The specimen was taken out through the 12 mm trocar.  The camera was reintroduced and there was a collection of blood in the cul-de-sac.  Identification of the source was obtained and this was cauterized using monopolar electrocautery until hemostasis was obtained.  Once verified this terminated the procedure.  The 12 mm trocar was removed and the fascia was reapproximated using a single stitch of 0 Vicryl.  The abdomen was desufflated of CO2.  The remaining trochars were removed.  The skin at all port sites was closed using 4-0 Monocryl in a subcuticular fashion.  Surgical skin glue was placed over all skin incisions.  The catheter was removed from the bladder.  The patient tolerated the procedure well.  Sponge, lap, needle, and instrument counts were correct x 2.  VTE prophylaxis: SCDs. Antibiotic prophylaxis: none indicated and none given. She was awakened in the operating room and was taken to the PACU in stable condition.   Thomasene Mohair, MD 11/06/2020 10:17 AM

## 2020-11-06 NOTE — Anesthesia Procedure Notes (Signed)
Procedure Name: Intubation Date/Time: 11/06/2020 7:43 AM Performed by: Danelle Berry, CRNA Pre-anesthesia Checklist: Patient identified, Emergency Drugs available, Suction available and Patient being monitored Patient Re-evaluated:Patient Re-evaluated prior to induction Oxygen Delivery Method: Circle system utilized Preoxygenation: Pre-oxygenation with 100% oxygen Induction Type: IV induction Ventilation: Mask ventilation without difficulty Laryngoscope Size: McGraph and 3 Grade View: Grade II Tube type: Oral Tube size: 7.0 mm Number of attempts: 1 Airway Equipment and Method: Stylet and Oral airway Placement Confirmation: ETT inserted through vocal cords under direct vision, positive ETCO2 and breath sounds checked- equal and bilateral Secured at: 22 cm Tube secured with: Tape Dental Injury: Teeth and Oropharynx as per pre-operative assessment

## 2020-11-06 NOTE — Transfer of Care (Signed)
Immediate Anesthesia Transfer of Care Note  Patient: Leslie Russell  Procedure(s) Performed: XI ROBOTIC ASSISTED LAPAROSCOPIC OVARIAN CYSTECTOMY (Right)  Patient Location: PACU  Anesthesia Type:General  Level of Consciousness: awake and alert   Airway & Oxygen Therapy: Patient Spontanous Breathing and Patient connected to face mask oxygen  Post-op Assessment: Report given to RN and Post -op Vital signs reviewed and stable  Post vital signs: Reviewed and stable  Last Vitals:  Vitals Value Taken Time  BP    Temp    Pulse    Resp    SpO2      Last Pain:  Vitals:   11/06/20 0639  TempSrc: Oral  PainSc: 0-No pain         Complications: No notable events documented.

## 2020-11-06 NOTE — Interval H&P Note (Signed)
History and Physical Interval Note:  11/06/2020 7:22 AM  Cala Bradford  has presented today for surgery, with the diagnosis of Right ovarian cyst N83.201 Pelvic pain R10.2.  The various methods of treatment have been discussed with the patient and family. After consideration of risks, benefits and other options for treatment, the patient has consented to  Procedure(s): XI ROBOTIC ASSISTED LAPAROSCOPIC OVARIAN CYSTECTOMY (Right) AND DIAGNOSTIC LAPAROSCOPY as a surgical intervention.  The patient's history has been reviewed, patient examined, no change in status, Stable for surgery.  I have reviewed the patient's chart and labs.  Questions were answered to the patient's satisfaction.  Site marked. Consent reviewed. Patient agrees to proceed.   Thomasene Mohair, MD, [facility], [facility] 11/06/2020 7:23 AM

## 2020-11-06 NOTE — Discharge Instructions (Signed)
AMBULATORY SURGERY  ?DISCHARGE INSTRUCTIONS ? ? ?The drugs that you were given will stay in your system until tomorrow so for the next 24 hours you should not: ? ?Drive an automobile ?Make any legal decisions ?Drink any alcoholic beverage ? ? ?You may resume regular meals tomorrow.  Today it is better to start with liquids and gradually work up to solid foods. ? ?You may eat anything you prefer, but it is better to start with liquids, then soup and crackers, and gradually work up to solid foods. ? ? ?Please notify your doctor immediately if you have any unusual bleeding, trouble breathing, redness and pain at the surgery site, drainage, fever, or pain not relieved by medication. ? ? ? ?Additional Instructions: ? ? ? ?Please contact your physician with any problems or Same Day Surgery at [phone], Monday through Friday 6 am to 4 pm, or [place] [facility] [place] Main number at [phone].  ?

## 2020-11-07 LAB — SURGICAL PATHOLOGY

## 2020-11-07 NOTE — Anesthesia Postprocedure Evaluation (Signed)
Anesthesia Post Note  Patient: Leslie Russell  Procedure(s) Performed: XI ROBOTIC ASSISTED LAPAROSCOPIC OVARIAN CYSTECTOMY (Right)  Patient location during evaluation: PACU Anesthesia Type: General Level of consciousness: awake and alert Pain management: pain level controlled Vital Signs Assessment: post-procedure vital signs reviewed and stable Respiratory status: spontaneous breathing, nonlabored ventilation, respiratory function stable and patient connected to nasal cannula oxygen Cardiovascular status: blood pressure returned to baseline and stable Postop Assessment: no apparent nausea or vomiting Anesthetic complications: no   No notable events documented.   Last Vitals:  Vitals:   11/06/20 1100 11/06/20 1121  BP: 124/66 (!) 123/57  Pulse: 88 60  Resp: 17 18  Temp: (!) 36.1 C 36.7 C  SpO2: 100% 100%    Last Pain:  Vitals:   11/06/20 1121  TempSrc: Temporal  PainSc: 3                  Lenard Simmer

## 2020-11-16 ENCOUNTER — Ambulatory Visit (INDEPENDENT_AMBULATORY_CARE_PROVIDER_SITE_OTHER): Payer: 59 | Admitting: Obstetrics and Gynecology

## 2020-11-16 ENCOUNTER — Other Ambulatory Visit: Payer: Self-pay

## 2020-11-16 ENCOUNTER — Encounter: Payer: Self-pay | Admitting: Obstetrics and Gynecology

## 2020-11-16 VITALS — BP 110/70 | Ht 65.0 in | Wt 132.0 lb

## 2020-11-16 DIAGNOSIS — Z09 Encounter for follow-up examination after completed treatment for conditions other than malignant neoplasm: Secondary | ICD-10-CM

## 2020-11-16 DIAGNOSIS — N801 Endometriosis of ovary: Secondary | ICD-10-CM

## 2020-11-16 DIAGNOSIS — N80129 Deep endometriosis of ovary, unspecified ovary: Secondary | ICD-10-CM

## 2020-11-16 NOTE — Patient Instructions (Signed)
Endometriosis Resources: From https://miller-johnson.net/:   SeeRatings.se   Medline Plus: https://sanders.org/.html   Endometriosis Association: FactoringRate.ca   (You can also look under the treatments section)   Endo foundation:   https://www.endofound.org/endometriosis-treatment-support   Endometriosis organization: ContactWire.is

## 2020-11-16 NOTE — Progress Notes (Signed)
   Postoperative Follow-up Patient presents post op from Operative laparoscopy, right ovarian cystectomy 10 days ago for  persistent right ovarian cyst, consistent with endometrioma .  DIAGNOSIS:  A. OVARIAN CYST, RIGHT; CYSTECTOMY:  - FRAGMENTS OF CYST WALL, COMPATIBLE WITH ENDOMETRIOMA.  - FRAGMENTS OF HEMORRHAGIC CORPUS LUTEUM CYST.  - NEGATIVE FOR MALIGNANCY.   Subjective: Patient reports  improvement in her preop symptoms. Eating a regular diet without difficulty. Pain control is adequate  Activity: increasingly slowly.  She denies fever, chills, nausea, and vomiting.   Objective: Vital Signs: BP 110/70   Ht 5\' 5"  (1.651 m)   Wt 132 lb (59.9 kg)   LMP 10/23/2020   BMI 21.97 kg/m  Physical Exam Constitutional:      General: She is not in acute distress.    Appearance: Normal appearance.  HENT:     Head: Normocephalic and atraumatic.  Eyes:     General: No scleral icterus.    Conjunctiva/sclera: Conjunctivae normal.  Abdominal:     General: There is no distension.     Palpations: Abdomen is soft.     Tenderness: There is no abdominal tenderness. There is no guarding or rebound.     Comments: Incisions: without erythema, induration, warmth, and tenderness. They are clean, dry, and intact.     Neurological:     General: No focal deficit present.     Mental Status: She is alert and oriented to person, place, and time.     Cranial Nerves: No cranial nerve deficit.  Psychiatric:        Mood and Affect: Mood normal.        Behavior: Behavior normal.        Judgment: Judgment normal.     Assessment: 20 y.o. s/p operative laparoscopy, right ovarian cystectomy for endometrioma progressing well  Plan: Patient has done well after surgery with no apparent complications.  I have discussed the post-operative course to date, and the expected progress moving forward.  The patient understands what complications to be concerned about.  I will see the patient in routine follow up,  or sooner if needed.    Activity plan: No restriction.  Long discussion regarding diagnosis of endometriosis and possible treatment options.  Resources provided through MyChart (See below)  26, MD  11/16/2020, 3:50 PM

## 2020-12-12 ENCOUNTER — Encounter: Payer: Self-pay | Admitting: Obstetrics and Gynecology

## 2020-12-28 ENCOUNTER — Other Ambulatory Visit: Payer: Self-pay | Admitting: Obstetrics and Gynecology

## 2020-12-28 DIAGNOSIS — Z30011 Encounter for initial prescription of contraceptive pills: Secondary | ICD-10-CM

## 2020-12-28 DIAGNOSIS — N80129 Deep endometriosis of ovary, unspecified ovary: Secondary | ICD-10-CM

## 2020-12-28 DIAGNOSIS — N809 Endometriosis, unspecified: Secondary | ICD-10-CM

## 2020-12-28 MED ORDER — LO LOESTRIN FE 1 MG-10 MCG / 10 MCG PO TABS
1.0000 | ORAL_TABLET | Freq: Every day | ORAL | 4 refills | Status: DC
  Filled 2021-01-04 – 2021-01-18 (×2): qty 84, 84d supply, fill #0
  Filled 2021-02-04 (×3): qty 28, 28d supply, fill #0
  Filled 2021-03-06: qty 28, 28d supply, fill #1
  Filled 2021-04-09: qty 28, 28d supply, fill #2
  Filled 2021-05-10: qty 28, 28d supply, fill #3
  Filled 2021-06-05: qty 28, 28d supply, fill #4

## 2021-01-04 ENCOUNTER — Other Ambulatory Visit: Payer: Self-pay

## 2021-01-04 ENCOUNTER — Other Ambulatory Visit (HOSPITAL_COMMUNITY): Payer: Self-pay

## 2021-01-15 ENCOUNTER — Other Ambulatory Visit: Payer: Self-pay

## 2021-01-18 ENCOUNTER — Telehealth: Payer: Self-pay

## 2021-01-18 ENCOUNTER — Telehealth: Payer: 59

## 2021-01-18 ENCOUNTER — Other Ambulatory Visit: Payer: Self-pay

## 2021-01-18 NOTE — Telephone Encounter (Signed)
Pt calling; has question about bc; when to take it each day?  [phone]  Adv pt what ever time of day she can remember to take it every day; needs to take it at the same time every day or at least within a three hour window.  Pt asked what will happen if she misses a pill; adv she may have some BTB; if misses a pill she can either take it then or wait and take two at a time however that may make her feel sick on her stomach.

## 2021-01-18 NOTE — Telephone Encounter (Signed)
Pt's Dad, Tasia Catchings, calling; [facility] adv him to call for bc rx for endometriosis; faxes have been sent for PA.  [phone]  Left detailed msg PA is in process.  Will let him know when we get an answer.

## 2021-01-21 ENCOUNTER — Telehealth: Payer: Self-pay

## 2021-01-21 ENCOUNTER — Other Ambulatory Visit: Payer: Self-pay

## 2021-01-21 NOTE — Telephone Encounter (Signed)
Pt calling the after hour nurse 01/19/21 11:54am; states she recently got on birth control and was told to start the week before her period, but her period started yesterday; is wondering if she should wait to start this bc or if she can start it anyway; denies any new or worse s/s of illness.  [phone] After hour nurse adv pt to follow the instructions given to her by the office.

## 2021-02-04 ENCOUNTER — Other Ambulatory Visit: Payer: Self-pay

## 2021-02-08 ENCOUNTER — Other Ambulatory Visit: Payer: Self-pay

## 2021-02-15 NOTE — Telephone Encounter (Signed)
Late Entry.  Pt has pills to start with her next period.

## 2021-03-06 ENCOUNTER — Other Ambulatory Visit: Payer: Self-pay

## 2021-03-15 ENCOUNTER — Other Ambulatory Visit: Payer: Self-pay

## 2021-03-15 ENCOUNTER — Other Ambulatory Visit: Payer: Self-pay | Admitting: Obstetrics and Gynecology

## 2021-03-15 ENCOUNTER — Ambulatory Visit (INDEPENDENT_AMBULATORY_CARE_PROVIDER_SITE_OTHER): Payer: 59

## 2021-03-15 DIAGNOSIS — N83201 Unspecified ovarian cyst, right side: Secondary | ICD-10-CM

## 2021-03-15 DIAGNOSIS — R102 Pelvic and perineal pain: Secondary | ICD-10-CM

## 2021-03-20 ENCOUNTER — Other Ambulatory Visit: Payer: Self-pay

## 2021-03-20 ENCOUNTER — Encounter: Payer: Self-pay | Admitting: Obstetrics and Gynecology

## 2021-03-20 ENCOUNTER — Ambulatory Visit: Payer: 59

## 2021-03-20 ENCOUNTER — Ambulatory Visit: Payer: 59 | Admitting: Obstetrics and Gynecology

## 2021-03-20 VITALS — BP 108/66 | HR 81 | Ht 65.0 in | Wt 138.0 lb

## 2021-03-20 DIAGNOSIS — N80129 Deep endometriosis of ovary, unspecified ovary: Secondary | ICD-10-CM | POA: Diagnosis not present

## 2021-03-20 DIAGNOSIS — R102 Pelvic and perineal pain: Secondary | ICD-10-CM

## 2021-03-20 DIAGNOSIS — N83201 Unspecified ovarian cyst, right side: Secondary | ICD-10-CM

## 2021-03-20 DIAGNOSIS — N809 Endometriosis, unspecified: Secondary | ICD-10-CM | POA: Diagnosis not present

## 2021-03-20 NOTE — Progress Notes (Signed)
Gynecology Ultrasound Follow Up   Chief Complaint  Patient presents with   Follow-up    GYN U/S  History of right endometrioma   History of Present Illness: Patient is a 20 y.o. female who presents today for ultrasound evaluation of the above .  Ultrasound demonstrates the following findings Adnexa: no masses seen  Uterus: anteverted with endometrial stripe  7.5 mm Additional: no other issues  SHe is taking lo loestrin FE and is doing well  Past Medical History:  Diagnosis Date   No known health problems    No pertinent past medical history     Past Surgical History:  Procedure Laterality Date   NO PAST SURGERIES     ROBOTIC ASSISTED LAPAROSCOPIC OVARIAN CYSTECTOMY Right 11/06/2020   Procedure: XI ROBOTIC ASSISTED LAPAROSCOPIC OVARIAN CYSTECTOMY;  Surgeon: Conard Novak, MD;  Location: [facility] ORS;  Service: Gynecology;  Laterality: Right;   Family History  Problem Relation Age of Onset   Prostate cancer Maternal Grandfather    Cancer - Colon Paternal Grandfather    Endometriosis Neg Hx     Social History   Socioeconomic History   Marital status: Single    Spouse name: Not on file   Number of children: Not on file   Years of education: Not on file   Highest education level: Not on file  Occupational History   Not on file  Tobacco Use   Smoking status: Never   Smokeless tobacco: Never  Vaping Use   Vaping Use: Never used  Substance and Sexual Activity   Alcohol use: Not Currently   Drug use: Not Currently   Sexual activity: Never    Birth control/protection: None  Other Topics Concern   Not on file  Social History Narrative   Not on file   Social Determinants of Health   Financial Resource Strain: Not on file  Food Insecurity: Not on file  Transportation Needs: Not on file  Physical Activity: Not on file  Stress: Not on file  Social Connections: Not on file  Intimate Partner Violence: Not on file    No Known Allergies  Prior to Admission  medications   Medication Sig Start Date End Date Taking? Authorizing Provider  Norethindrone-Ethinyl Estradiol-Fe Biphas (LO LOESTRIN FE) 1 MG-10 MCG / 10 MCG tablet Take 1 tablet by mouth daily. 12/28/20 04/05/21 Yes Conard Novak, MD    Physical Exam BP 108/66 (Cuff Size: Normal)   Pulse 81   Ht 5\' 5"  (1.651 m)   Wt 138 lb (62.6 kg)   LMP 03/11/2021   BMI 22.96 kg/m    General: NAD HEENT: normocephalic, anicteric Pulmonary: No increased work of breathing Extremities: no edema, erythema, or tenderness Neurologic: Grossly intact, normal gait Psychiatric: mood appropriate, affect full  Imaging Results 13/11/2020 PELVIS (TRANSABDOMINAL ONLY)  Result Date: 03/16/2021 Patient Name: Leslie Russell DOB: October 15, 2000 MRN: 2000 ULTRASOUND REPORT Location: [facility] Date of Service: 03/15/2021 Indications: status post right Ovarian Endometrioma 10/2020.  LMP 03/12/21. Findings: The uterus is anteverted and measures 5.9 x 3.1 x 4.0 cm. Echo texture is homogenous without evidence of focal masses. The Endometrium measures 7.5 mm. Right Ovary measures 1.8 x 1.8 xx 1.6 cm. It is normal in appearance. Left Ovary measures 1.7 x 1.4 x 1.4 cm. It is normal in appearance. Survey of the adnexa demonstrates no adnexal masses. There is no free fluid in the cul de sac. Impression: 1.Normal pelvic ultrasound. 13/8/22  Leslie Russell The ultrasound images and findings  were reviewed by me and I agree with the above report. Thomasene Mohair, MD, [facility], [facility] 03/16/2021 9:43 PM      Assessment: 20 y.o. G0P0000  1. Endometriosis determined by laparoscopy   2. Endometrioma of ovary      Plan: Problem List Items Addressed This Visit       Endocrine   Endometrioma of ovary   Other Visit Diagnoses     Endometriosis determined by laparoscopy    -  Primary      Doing well. Continue current treatment.  Recommend routine annual exam as she is due for a pap smear.    A total of 17 minutes were spent face-to-face with the patient as well as preparation, review, communication, and documentation during this encounter.    Thomasene Mohair, MD, [facility], [facility] 03/20/2021 3:22 PM

## 2021-04-05 DIAGNOSIS — K130 Diseases of lips: Secondary | ICD-10-CM | POA: Diagnosis not present

## 2021-04-05 DIAGNOSIS — Z23 Encounter for immunization: Secondary | ICD-10-CM | POA: Diagnosis not present

## 2021-04-09 ENCOUNTER — Other Ambulatory Visit: Payer: Self-pay

## 2021-04-25 NOTE — Progress Notes (Deleted)
PCP:  Geradine Girt, MD   No chief complaint on file.    HPI:      Ms. Leslie Russell is a 20 y.o. G0P0000 whose LMP was No LMP recorded., presents today for her annual examination.  Her menses are {norm/abn:715}, lasting {number: 22536} days.  Dysmenorrhea {dysmen:716}. She {does:18564} have intermenstrual bleeding. Hx of endometriosis on dx lap and endometrioma. Sx controlled with OCPs  Sex activity: {sex active: 315163}.  Last Pap: {WUJW:[id number]}  Results were: {norm/abn:16707::"no abnormalities"} /neg HPV DNA *** Hx of STDs: {STD hx:14358}  There is no FH of breast cancer. There is no FH of ovarian cancer. The patient {does:18564} do self-breast exams.  Tobacco use: {tob:20664} Alcohol use: {Alcohol:11675} No drug use.  Exercise: {exercise:31265}  She {does:18564} get adequate calcium and Vitamin D in her diet.  Patient Active Problem List   Diagnosis Date Noted   Endometrioma of ovary 11/06/2020   Pelvic pain 11/06/2020    Past Surgical History:  Procedure Laterality Date   NO PAST SURGERIES     ROBOTIC ASSISTED LAPAROSCOPIC OVARIAN CYSTECTOMY Right 11/06/2020   Procedure: XI ROBOTIC ASSISTED LAPAROSCOPIC OVARIAN CYSTECTOMY;  Surgeon: Conard Novak, MD;  Location: [facility] ORS;  Service: Gynecology;  Laterality: Right;    Family History  Problem Relation Age of Onset   Prostate cancer Maternal Grandfather    Cancer - Colon Paternal Grandfather    Endometriosis Neg Hx     Social History   Socioeconomic History   Marital status: Single    Spouse name: Not on file   Number of children: Not on file   Years of education: Not on file   Highest education level: Not on file  Occupational History   Not on file  Tobacco Use   Smoking status: Never   Smokeless tobacco: Never  Vaping Use   Vaping Use: Never used  Substance and Sexual Activity   Alcohol use: Not Currently   Drug use: Not Currently   Sexual activity: Never    Birth  control/protection: None  Other Topics Concern   Not on file  Social History Narrative   Not on file   Social Determinants of Health   Financial Resource Strain: Not on file  Food Insecurity: Not on file  Transportation Needs: Not on file  Physical Activity: Not on file  Stress: Not on file  Social Connections: Not on file  Intimate Partner Violence: Not on file     Current Outpatient Medications:    HYDROcodone-acetaminophen (NORCO/VICODIN) 5-325 MG tablet, Take 1 tablet by mouth every 6 (six) hours as needed (breakthrough pain). (Patient not taking: No sig reported), Disp: 28 tablet, Rfl: 0   ibuprofen (ADVIL) 800 MG tablet, Take 1 tablet (800 mg total) by mouth every 8 (eight) hours as needed. (Patient not taking: No sig reported), Disp: 30 tablet, Rfl: 0   Norethindrone-Ethinyl Estradiol-Fe Biphas (LO LOESTRIN FE) 1 MG-10 MCG / 10 MCG tablet, Take 1 tablet by mouth daily., Disp: 84 tablet, Rfl: 4   ondansetron (ZOFRAN ODT) 4 MG disintegrating tablet, Take 1 tablet (4 mg total) by mouth every 8 (eight) hours as needed for nausea or vomiting. (Patient not taking: No sig reported), Disp: 20 tablet, Rfl: 0     ROS:  Review of Systems BREAST: No symptoms   Objective: There were no vitals taken for this visit.   OBGyn Exam  Results: No results found for this or any previous visit (from the past 24 hour(s)).  Assessment/Plan:  No diagnosis found.  No orders of the defined types were placed in this encounter.            GYN counsel {counseling: 16159}     F/U  No follow-ups on file.  Treyden Hakim B. Taim Wurm, PA-C 04/25/2021 4:34 PM

## 2021-04-30 ENCOUNTER — Ambulatory Visit: Payer: 59 | Admitting: Obstetrics and Gynecology

## 2021-05-10 ENCOUNTER — Other Ambulatory Visit: Payer: Self-pay

## 2021-05-14 ENCOUNTER — Ambulatory Visit: Payer: 59 | Admitting: Obstetrics and Gynecology

## 2021-05-22 ENCOUNTER — Ambulatory Visit: Payer: 59 | Admitting: Obstetrics and Gynecology

## 2021-06-05 ENCOUNTER — Other Ambulatory Visit: Payer: Self-pay

## 2021-06-20 NOTE — Progress Notes (Signed)
PCP:  Rudy Jew, MD   Chief Complaint  Patient presents with   Gynecologic Exam    No concerns     HPI:      Ms. Leslie Russell is a 21 y.o. G0P0000 whose LMP was Patient's last menstrual period was 06/03/2021 (approximate)., presents today for her annual examination.  Her menses are regular every 28-30 days, lasting 2-3 days.  Dysmenorrhea much improved with OCPs/endometrioma removal. Takes NSAIDs prn with sx relief.  She does not have intermenstrual bleeding.  Hx of complex RTO cyst/process 5/22 with RT lap ovarian cystectomy; path showed endometrioma. Normal Gyn u/s f/u 11/22. On OCPs for endometriosis.   Sex activity: never sexually active Last Pap: N/A in past due to age Hx of STDs: none  There is no FH of breast cancer. There is no FH of ovarian cancer. The patient does not do self-breast exams.  Tobacco use: The patient denies current or previous tobacco use. Alcohol use: none No drug use.  Exercise: min active  She does get adequate calcium but not Vitamin D in her diet. Gardasil completed  Patient Active Problem List   Diagnosis Date Noted   Endometriosis determined by laparoscopy 06/24/2021   Endometrioma of ovary 11/06/2020   Pelvic pain 11/06/2020    Past Surgical History:  Procedure Laterality Date   NO PAST SURGERIES     ROBOTIC ASSISTED LAPAROSCOPIC OVARIAN CYSTECTOMY Right 11/06/2020   Procedure: XI ROBOTIC ASSISTED LAPAROSCOPIC OVARIAN CYSTECTOMY;  Surgeon: Will Bonnet, MD;  Location: [facility] ORS;  Service: Gynecology;  Laterality: Right;    Family History  Problem Relation Age of Onset   Diabetes Mother    Prostate cancer Maternal Grandfather    Cancer - Colon Paternal Grandfather    Endometriosis Neg Hx     Social History   Socioeconomic History   Marital status: Single    Spouse name: Not on file   Number of children: Not on file   Years of education: Not on file   Highest education level: Not on file  Occupational  History   Not on file  Tobacco Use   Smoking status: Never   Smokeless tobacco: Never  Vaping Use   Vaping Use: Never used  Substance and Sexual Activity   Alcohol use: Not Currently   Drug use: Not Currently   Sexual activity: Never    Birth control/protection: None, Pill  Other Topics Concern   Not on file  Social History Narrative   Not on file   Social Determinants of Health   Financial Resource Strain: Not on file  Food Insecurity: Not on file  Transportation Needs: Not on file  Physical Activity: Not on file  Stress: Not on file  Social Connections: Not on file  Intimate Partner Violence: Not on file     Current Outpatient Medications:    Norethindrone-Ethinyl Estradiol-Fe Biphas (LO LOESTRIN FE) 1 MG-10 MCG / 10 MCG tablet, Take 1 tablet by mouth daily., Disp: 84 tablet, Rfl: 3     ROS:  Review of Systems  Constitutional:  Negative for fatigue, fever and unexpected weight change.  Respiratory:  Negative for cough, shortness of breath and wheezing.   Cardiovascular:  Negative for chest pain, palpitations and leg swelling.  Gastrointestinal:  Negative for blood in stool, constipation, diarrhea, nausea and vomiting.  Endocrine: Negative for cold intolerance, heat intolerance and polyuria.  Genitourinary:  Negative for dyspareunia, dysuria, flank pain, frequency, genital sores, hematuria, menstrual problem, pelvic pain, urgency, vaginal  bleeding, vaginal discharge and vaginal pain.  Musculoskeletal:  Negative for back pain, joint swelling and myalgias.  Skin:  Negative for rash.  Neurological:  Negative for dizziness, syncope, light-headedness, numbness and headaches.  Hematological:  Negative for adenopathy.  Psychiatric/Behavioral:  Negative for agitation, confusion, sleep disturbance and suicidal ideas. The patient is not nervous/anxious.   BREAST: No symptoms   Objective: BP 100/70    Ht 5' 5.5" (1.664 m)    Wt 139 lb (63 kg)    LMP 06/03/2021 (Approximate)     BMI 22.78 kg/m    Physical Exam Constitutional:      Appearance: She is well-developed.  Genitourinary:     Vulva normal.     Right Labia: No rash, tenderness or lesions.    Left Labia: No tenderness, lesions or rash.    No vaginal discharge, erythema or tenderness.      Right Adnexa: not tender and no mass present.    Left Adnexa: not tender and no mass present.    No cervical friability or polyp.     Uterus is not enlarged or tender.  Breasts:    Right: No mass, nipple discharge, skin change or tenderness.     Left: No mass, nipple discharge, skin change or tenderness.  Neck:     Thyroid: No thyromegaly.  Cardiovascular:     Rate and Rhythm: Normal rate and regular rhythm.     Heart sounds: Normal heart sounds. No murmur heard. Pulmonary:     Effort: Pulmonary effort is normal.     Breath sounds: Normal breath sounds.  Abdominal:     Palpations: Abdomen is soft.     Tenderness: There is no abdominal tenderness. There is no guarding or rebound.  Musculoskeletal:        General: Normal range of motion.     Cervical back: Normal range of motion.  Lymphadenopathy:     Cervical: No cervical adenopathy.  Neurological:     General: No focal deficit present.     Mental Status: She is alert and oriented to person, place, and time.     Cranial Nerves: No cranial nerve deficit.  Skin:    General: Skin is warm and dry.  Psychiatric:        Mood and Affect: Mood normal.        Behavior: Behavior normal.        Thought Content: Thought content normal.        Judgment: Judgment normal.  Vitals reviewed.    Assessment/Plan: Encounter for annual routine gynecological examination  Cervical cancer screening - Plan: Cytology - PAP  Encounter for surveillance of contraceptive pills - Plan: Norethindrone-Ethinyl Estradiol-Fe Biphas (LO LOESTRIN FE) 1 MG-10 MCG / 10 MCG tablet; OCP RF. Doing well.   Endometrioma of ovary - Plan: Norethindrone-Ethinyl Estradiol-Fe Biphas (LO  LOESTRIN FE) 1 MG-10 MCG / 10 MCG tablet  Endometriosis determined by laparoscopy - Plan: Norethindrone-Ethinyl Estradiol-Fe Biphas (LO LOESTRIN FE) 1 MG-10 MCG / 10 MCG tablet  Meds ordered this encounter  Medications   Norethindrone-Ethinyl Estradiol-Fe Biphas (LO LOESTRIN FE) 1 MG-10 MCG / 10 MCG tablet    Sig: Take 1 tablet by mouth daily.    Dispense:  84 tablet    Refill:  3    Order Specific Question:   Supervising Provider    Answer:   Gae Dry [id number]             GYN counsel adequate intake of calcium and vitamin  D, diet and exercise     F/U  Return in about 1 year (around 06/24/2022).  Joey Hudock B. Narcisa Ganesh, PA-C 06/24/2021 10:36 AM

## 2021-06-24 ENCOUNTER — Other Ambulatory Visit (HOSPITAL_COMMUNITY)
Admission: RE | Admit: 2021-06-24 | Discharge: 2021-06-24 | Disposition: A | Discharge status: Home / Self Care | Payer: 59 | Source: Ambulatory Visit | Attending: Obstetrics and Gynecology | Admitting: Obstetrics and Gynecology

## 2021-06-24 ENCOUNTER — Ambulatory Visit (INDEPENDENT_AMBULATORY_CARE_PROVIDER_SITE_OTHER): Payer: 59 | Admitting: Obstetrics and Gynecology

## 2021-06-24 ENCOUNTER — Other Ambulatory Visit: Payer: Self-pay

## 2021-06-24 ENCOUNTER — Encounter: Payer: Self-pay | Admitting: Obstetrics and Gynecology

## 2021-06-24 VITALS — BP 100/70 | Ht 65.5 in | Wt 139.0 lb

## 2021-06-24 DIAGNOSIS — N809 Endometriosis, unspecified: Secondary | ICD-10-CM | POA: Diagnosis not present

## 2021-06-24 DIAGNOSIS — Z113 Encounter for screening for infections with a predominantly sexual mode of transmission: Secondary | ICD-10-CM

## 2021-06-24 DIAGNOSIS — Z124 Encounter for screening for malignant neoplasm of cervix: Secondary | ICD-10-CM | POA: Insufficient documentation

## 2021-06-24 DIAGNOSIS — Z3041 Encounter for surveillance of contraceptive pills: Secondary | ICD-10-CM

## 2021-06-24 DIAGNOSIS — N80129 Deep endometriosis of ovary, unspecified ovary: Secondary | ICD-10-CM

## 2021-06-24 DIAGNOSIS — Z01419 Encounter for gynecological examination (general) (routine) without abnormal findings: Secondary | ICD-10-CM

## 2021-06-24 MED ORDER — LO LOESTRIN FE 1 MG-10 MCG / 10 MCG PO TABS
1.0000 | ORAL_TABLET | Freq: Every day | ORAL | 3 refills | Status: DC
  Filled 2021-06-24: qty 84, 84d supply, fill #0
  Filled 2021-07-05: qty 28, 28d supply, fill #0
  Filled 2021-07-30: qty 28, 28d supply, fill #1
  Filled 2021-08-26: qty 28, 28d supply, fill #2
  Filled 2021-09-24: qty 28, 28d supply, fill #3
  Filled 2021-10-16: qty 28, 28d supply, fill #4
  Filled 2021-11-14: qty 28, 28d supply, fill #5
  Filled 2021-12-11: qty 28, 28d supply, fill #6
  Filled 2022-01-10: qty 28, 28d supply, fill #7
  Filled 2022-02-07: qty 28, 28d supply, fill #8
  Filled 2022-03-07: qty 28, 28d supply, fill #9
  Filled 2022-04-03: qty 28, 28d supply, fill #10
  Filled 2022-05-02: qty 28, 28d supply, fill #11

## 2021-06-26 LAB — CYTOLOGY - PAP
Adequacy: ABSENT
Diagnosis: NEGATIVE

## 2021-07-05 ENCOUNTER — Other Ambulatory Visit: Payer: Self-pay

## 2021-07-30 ENCOUNTER — Other Ambulatory Visit: Payer: Self-pay

## 2021-08-26 ENCOUNTER — Other Ambulatory Visit: Payer: Self-pay

## 2021-09-09 IMAGING — US US PELVIS COMPLETE
1 series · 13 of 25 positions shown · non-contrast
Comparison: None.

CLINICAL DATA: Pelvic pain

EXAM:
TRANSABDOMINAL ULTRASOUND OF PELVIS
DOPPLER ULTRASOUND OF OVARIES
TECHNIQUE: Transabdominal technique was performed for global imaging of the
pelvis including uterus, ovaries, adnexal regions, and pelvic
cul-de-sac.
Color and duplex Doppler ultrasound was utilized to evaluate blood
flow to the ovaries.

[Series 1: us pelvis (transabdominal only) · 52 acquisitions, 13 frames shown]
[im 1/52]
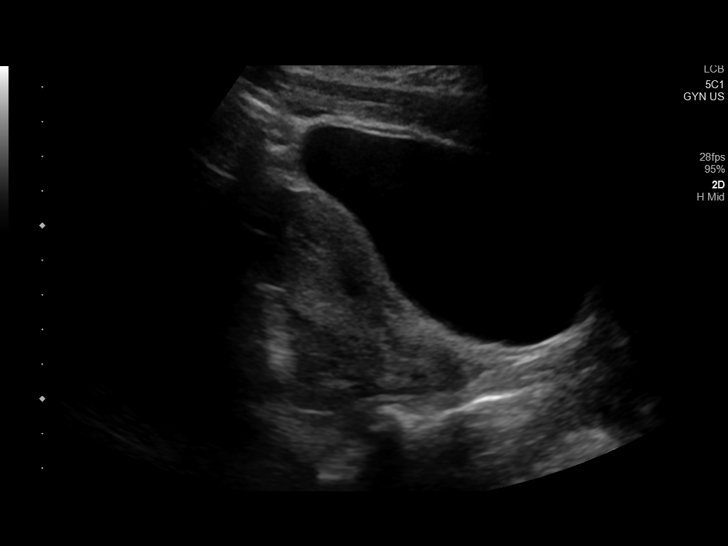
[im 5/52]
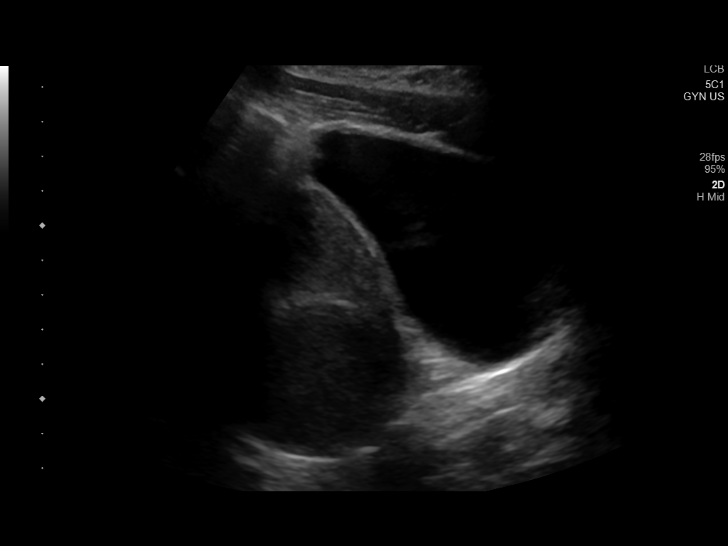
[im 9/52]
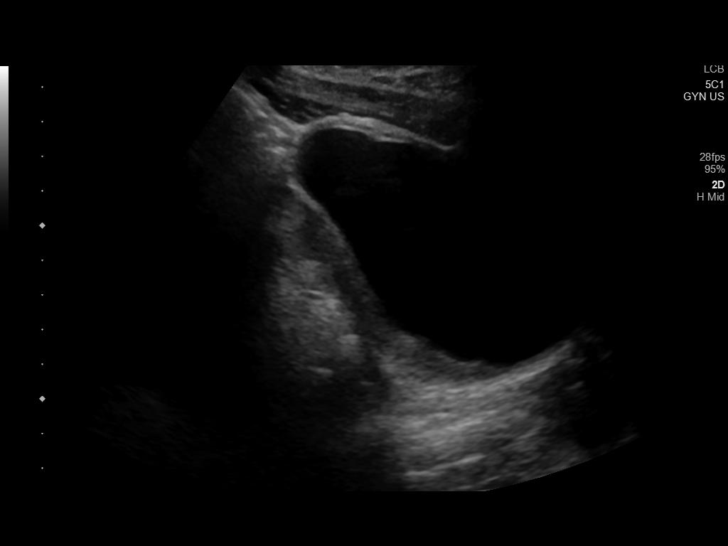
[im 13/52]
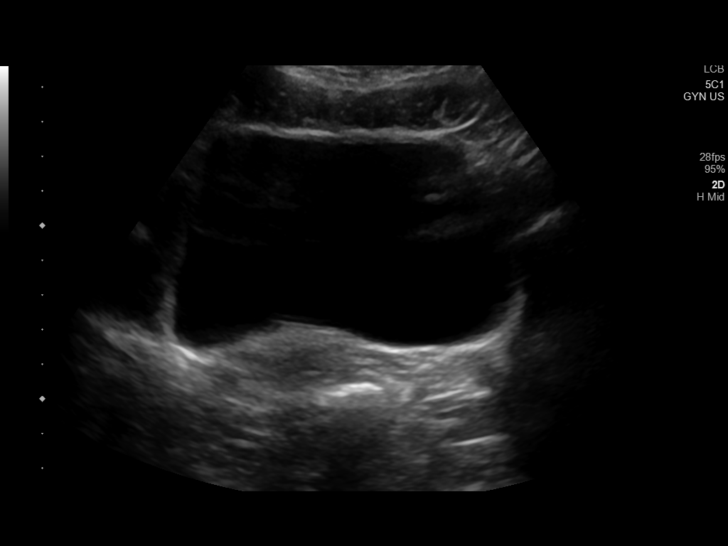
[im 18/52]
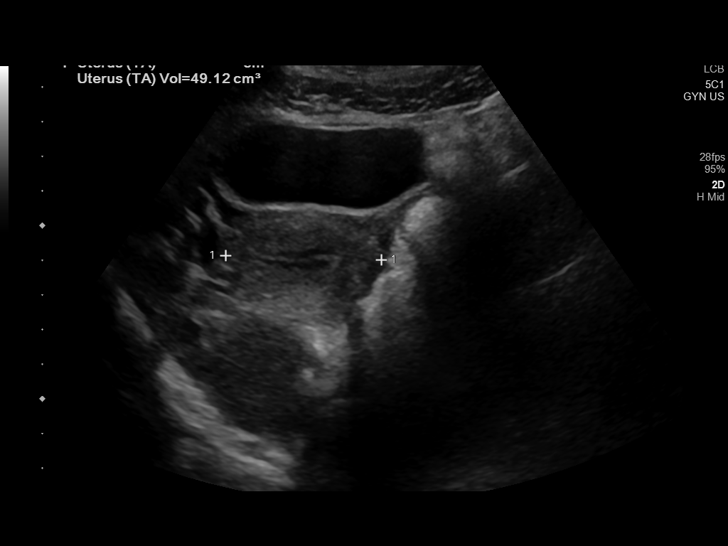
[im 22/52]
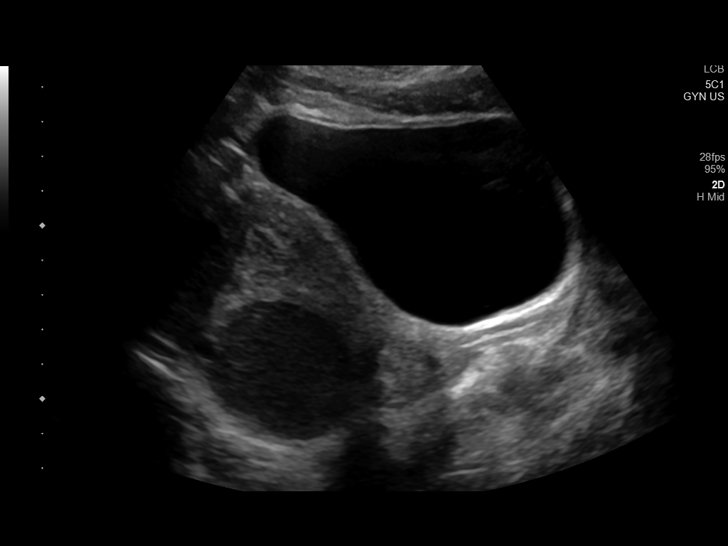
[im 26/52]
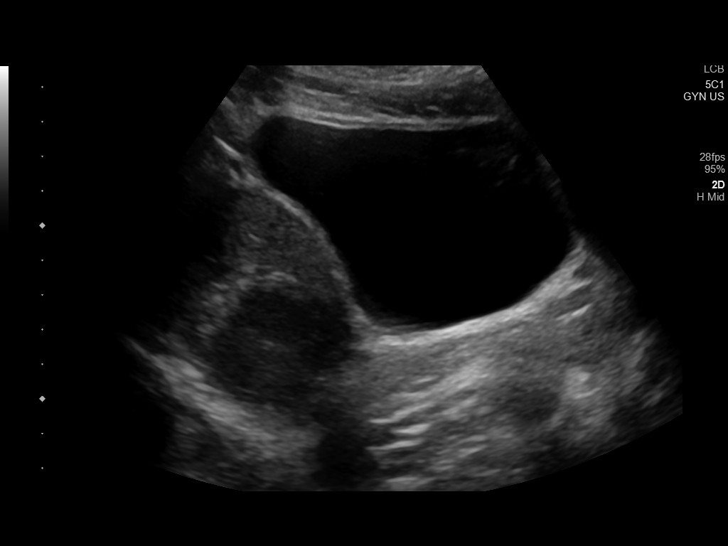
[im 30/52]
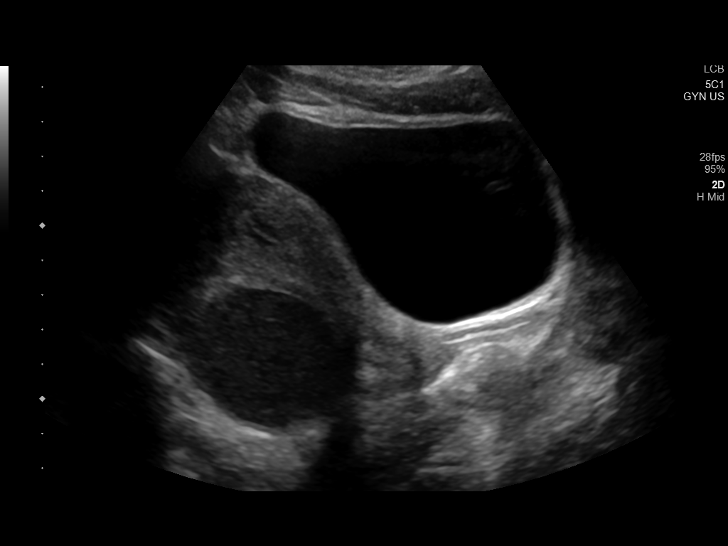
[im 35/52]
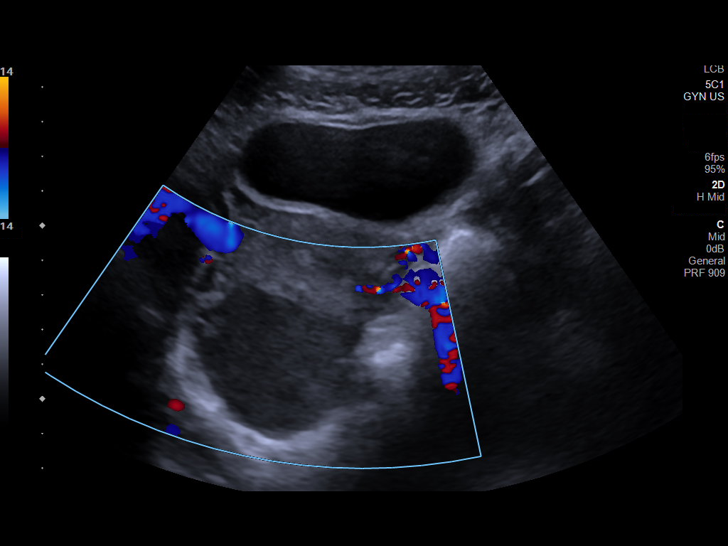
[im 39/52]
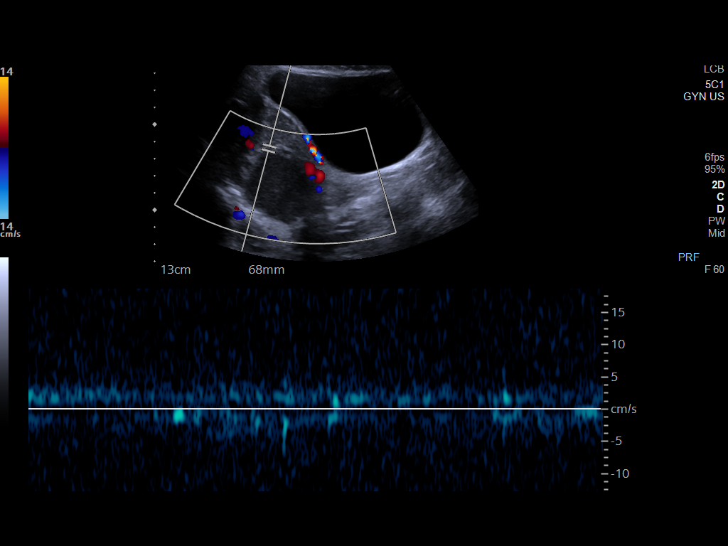
[im 43/52]
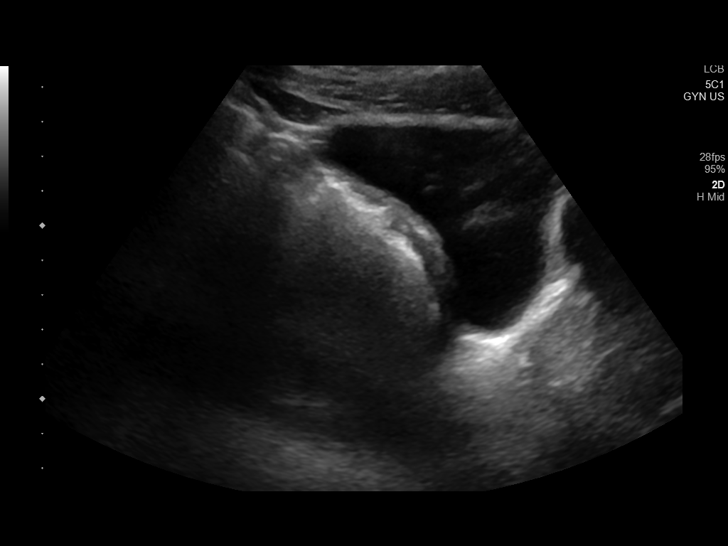
[im 47/52]
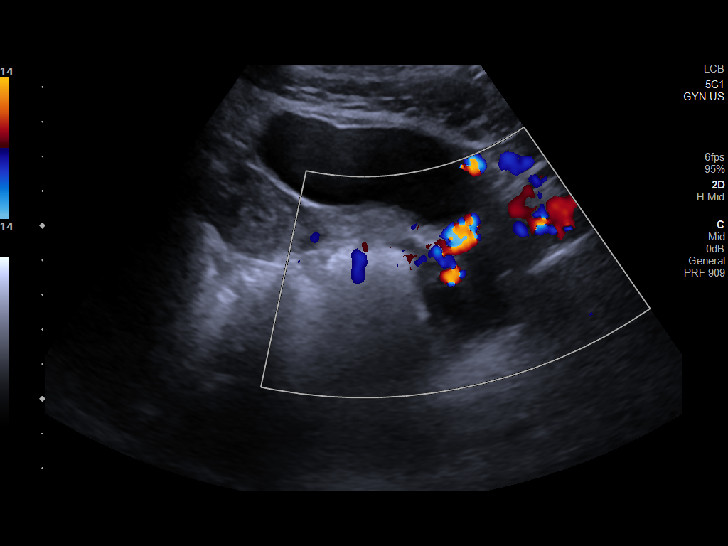
[im 52/52]
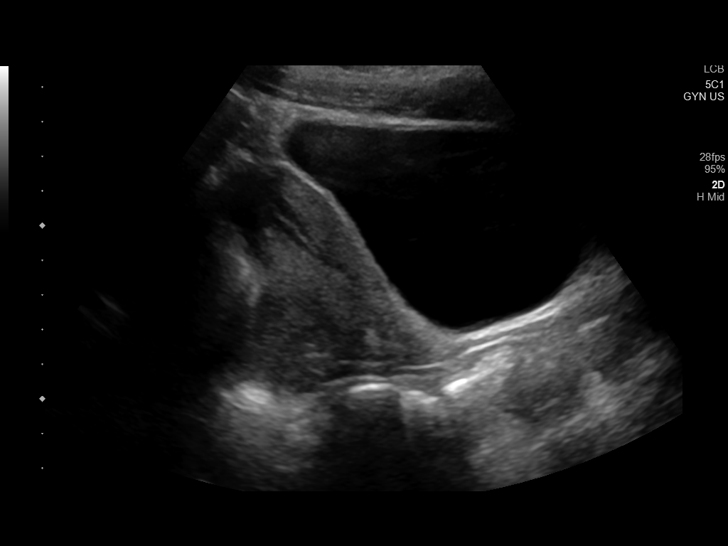

[13 of 25 positions shown; findings below may reference images not displayed]

FINDINGS: Uterus

Measurements: 7.3 x 2.8 x 4.5 cm = volume: 49 mL. No fibroids or
other mass visualized.

Endometrium

Thickness: 5.1 mm.  No focal abnormality visualized.

Right ovary

Measurements: 5.3 x 4.1 x 4.8 cm = volume: 55 mL. There is a rounded
mass closely associated with the right ovary that does not contain
significant internal color Doppler flow. This measures 4.4 x 3.7 x
3.6 cm and contains low level internal echoes.

Left ovary

Measurements: 2.9 x 1.4 x 1.6 cm = volume: 3 mL. Normal
appearance/no adnexal mass.

Pulsed Doppler evaluation of both ovaries demonstrates normal
low-resistance arterial and venous waveforms.

Other findings

There is a small volume of pelvic free fluid.
IMPRESSION: 1. No definite sonographic evidence for ovarian torsion.
2. There is a 4.4 cm nonvascular mass involving the right ovary with
low level internal echoes. This is favored to represent an
endometrioma. A follow-up ultrasound is recommended in 6-12 weeks.

## 2021-09-24 ENCOUNTER — Other Ambulatory Visit: Payer: Self-pay

## 2021-10-16 ENCOUNTER — Other Ambulatory Visit: Payer: Self-pay

## 2021-11-14 ENCOUNTER — Other Ambulatory Visit: Payer: Self-pay

## 2021-11-30 IMAGING — US US PELVIS COMPLETE
1 series · 15 of 25 positions shown · non-contrast
Comparison: Prior ultrasound from 06/22/2020

CLINICAL DATA: Follow-up examination for right ovarian cyst.

EXAM:
TRANSABDOMINAL ULTRASOUND OF PELVIS
TECHNIQUE: Transabdominal ultrasound examination of the pelvis was performed
including evaluation of the uterus, ovaries, adnexal regions, and
pelvic cul-de-sac.

[Series 1: us pelvis complete · 75 acquisitions, 15 frames shown]
[im 1/75]
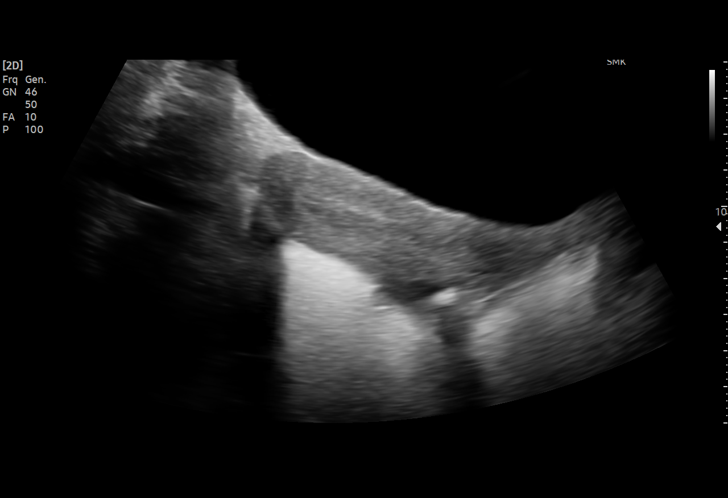
[im 7/75]
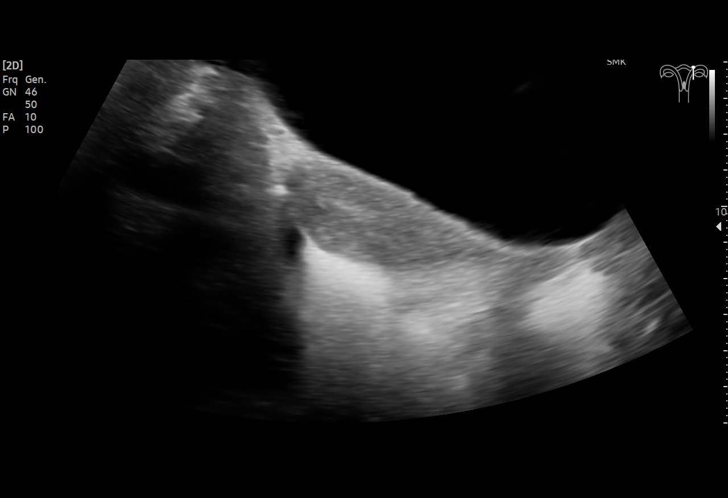
[im 13/75]
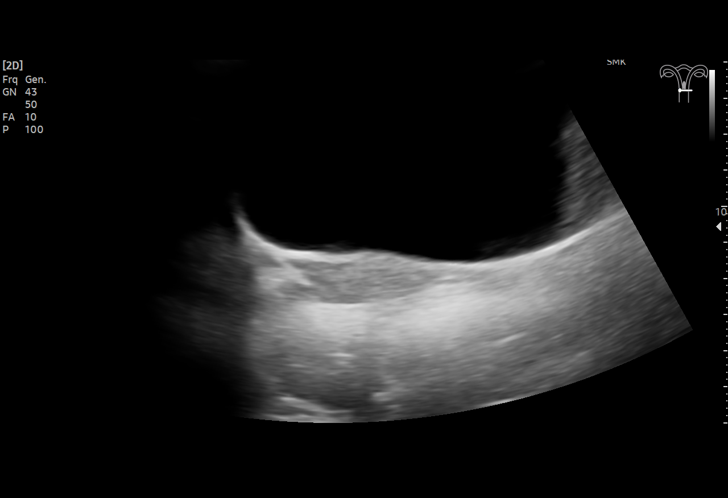
[im 16/75]
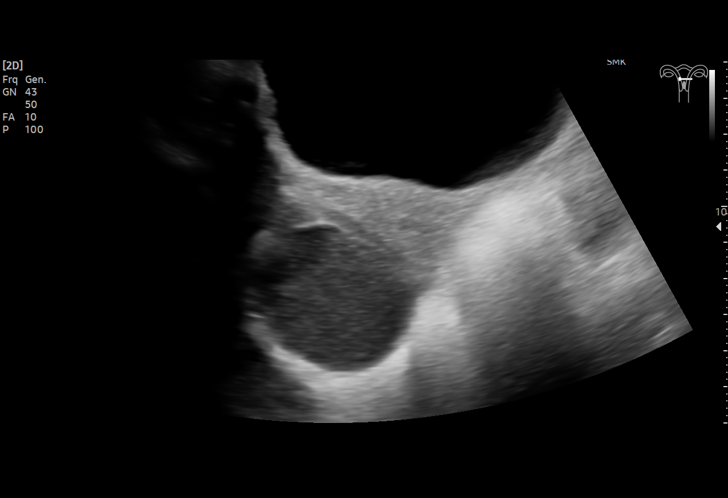
[im 22/75]
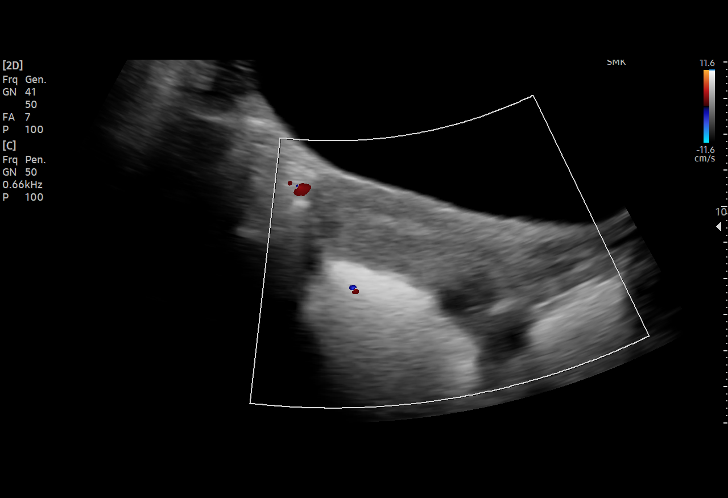
[im 28/75]
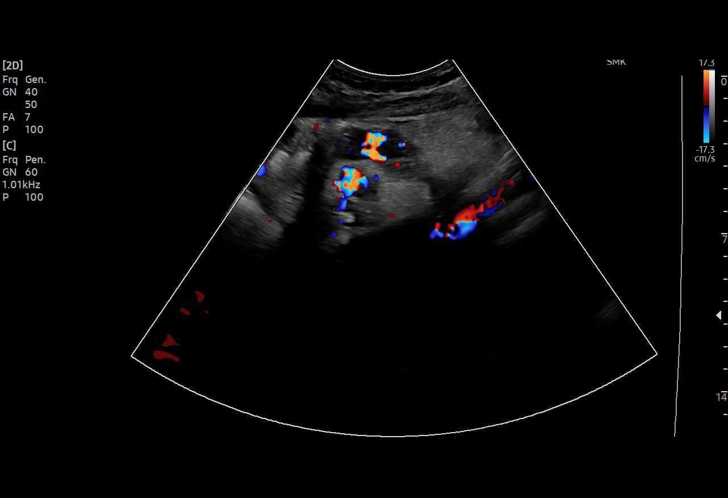
[im 31/75]
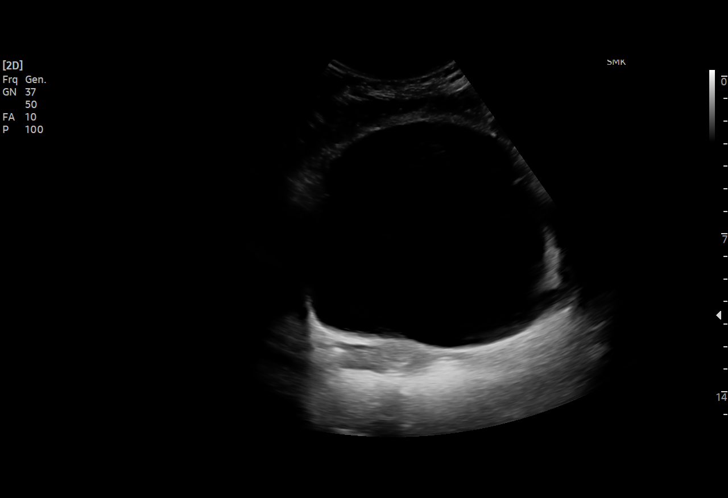
[im 38/75]
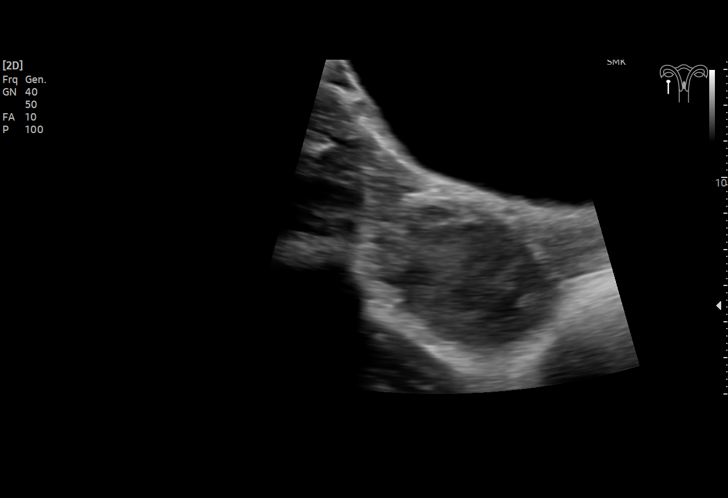
[im 44/75]
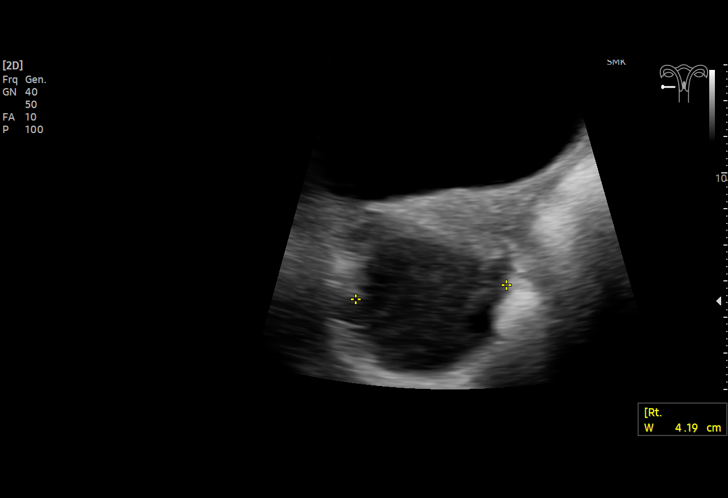
[im 47/75]
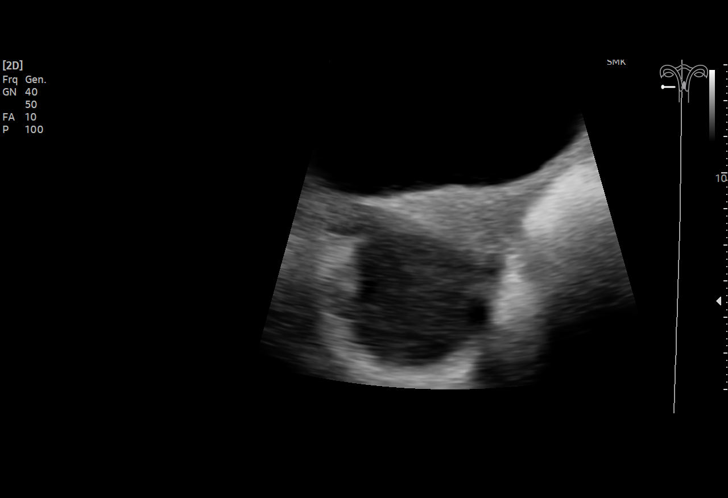
[im 53/75]
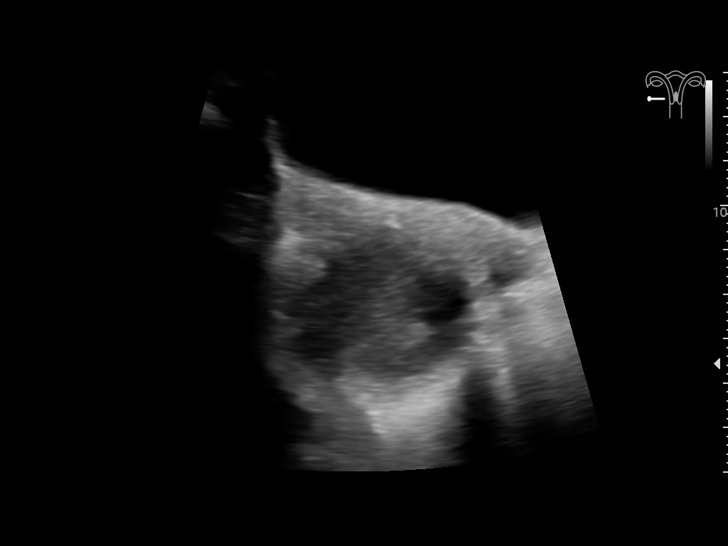
[im 59/75]
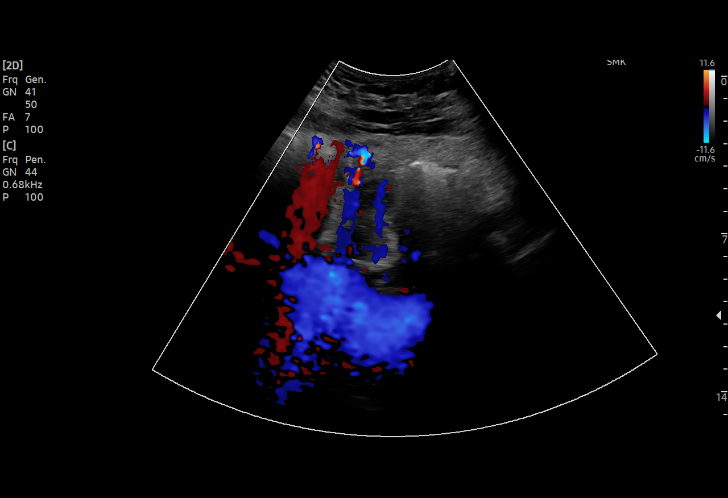
[im 62/75]
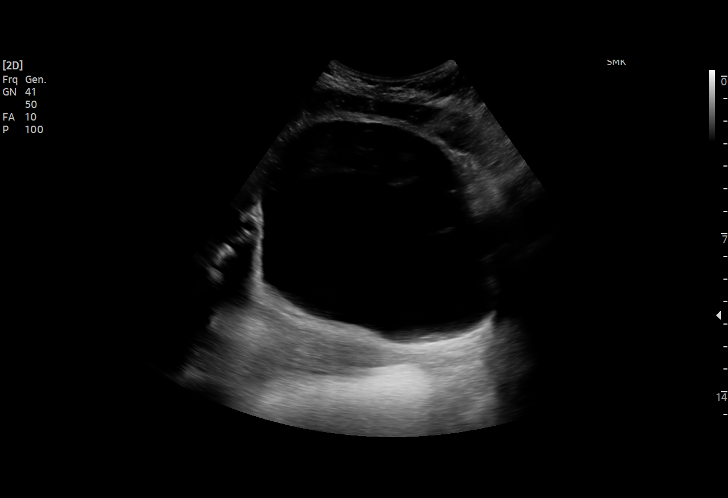
[im 68/75]
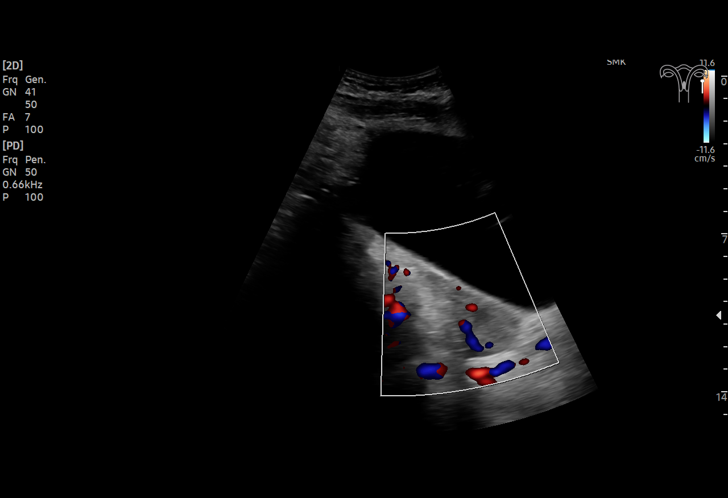
[im 75/75]
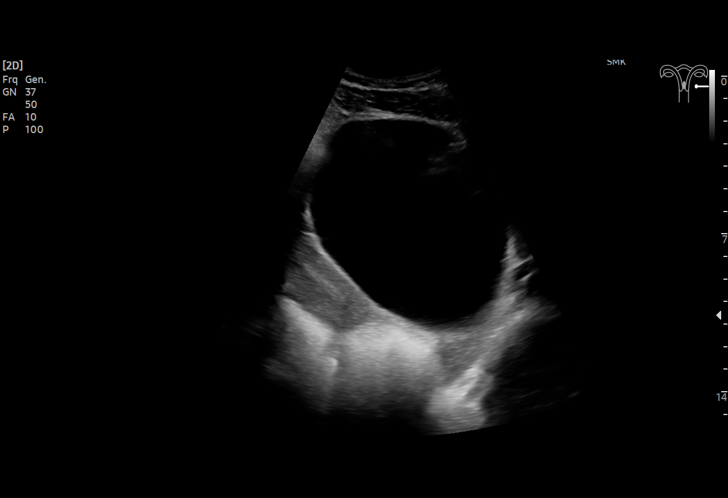

[15 of 25 positions shown; findings below may reference images not displayed]

FINDINGS: Uterus

Measurements: 7.9 x 2.4 x 4.4 cm = volume: 44.2 mL. No fibroids or
other mass visualized.

Endometrium

Thickness: 4.9 mm.  No focal abnormality visualized.

Right ovary

Measurements: 5.7 x 3.8 x 4.2 cm = volume: 47.6 mL. Again seen is a
complex hypoechoic lesion arising from the right ovary, measuring
4.4 x 3.9 x 4.3 cm. Lesion demonstrates homogeneous low-level
internal echoes without associated vascularity or solid component.
Appearance is stable from previous.

Left ovary

Measurements: 5.2 x 2.9 x 2.8 cm = volume: 22.7 mL. Normal
appearance/no adnexal mass.

Other findings:  No abnormal free fluid.
IMPRESSION: 1. No significant interval change in 4.4 cm complex right ovarian
cyst, most consistent with a probable endometrioma.
2. Otherwise unremarkable and normal pelvic ultrasound.

## 2021-12-11 ENCOUNTER — Other Ambulatory Visit: Payer: Self-pay

## 2022-01-10 ENCOUNTER — Other Ambulatory Visit: Payer: Self-pay

## 2022-02-07 ENCOUNTER — Other Ambulatory Visit: Payer: Self-pay

## 2022-03-07 ENCOUNTER — Other Ambulatory Visit: Payer: Self-pay

## 2022-04-03 ENCOUNTER — Other Ambulatory Visit: Payer: Self-pay

## 2022-05-02 ENCOUNTER — Other Ambulatory Visit: Payer: Self-pay

## 2022-05-30 ENCOUNTER — Other Ambulatory Visit: Payer: Self-pay | Admitting: Obstetrics and Gynecology

## 2022-05-30 ENCOUNTER — Encounter: Payer: Self-pay | Admitting: Obstetrics and Gynecology

## 2022-05-30 ENCOUNTER — Other Ambulatory Visit: Payer: Self-pay

## 2022-05-30 DIAGNOSIS — N809 Endometriosis, unspecified: Secondary | ICD-10-CM

## 2022-05-30 DIAGNOSIS — Z3041 Encounter for surveillance of contraceptive pills: Secondary | ICD-10-CM

## 2022-05-30 DIAGNOSIS — N80129 Deep endometriosis of ovary, unspecified ovary: Secondary | ICD-10-CM

## 2022-05-30 MED ORDER — LO LOESTRIN FE 1 MG-10 MCG / 10 MCG PO TABS
1.0000 | ORAL_TABLET | Freq: Every day | ORAL | 0 refills | Status: DC
  Filled 2022-05-30: qty 84, 84d supply, fill #0

## 2022-07-06 NOTE — Progress Notes (Unsigned)
PCP:  Rudy Jew, MD   No chief complaint on file.    HPI:      Leslie Russell is a 22 y.o. G0P0000 whose LMP was No LMP recorded., presents today for her annual examination.  Her menses are regular every 28-30 days, lasting 2-3 days.  Dysmenorrhea much improved with OCPs/endometrioma removal. Takes NSAIDs prn with sx relief.  She does not have intermenstrual bleeding.  Hx of complex RTO cyst/process 5/22 with RT lap ovarian cystectomy; path showed endometrioma. Normal Gyn u/s f/u 11/22. On OCPs for endometriosis.   Sex activity: never sexually active Last Pap: 06/24/21  Results were no abnormalities Hx of STDs: none  There is no FH of breast cancer. There is no FH of ovarian cancer. The patient does not do self-breast exams.  Tobacco use: The patient denies current or previous tobacco use. Alcohol use: none No drug use.  Exercise: min active  She does get adequate calcium but not Vitamin D in her diet. Gardasil completed  Patient Active Problem List   Diagnosis Date Noted   Endometriosis determined by laparoscopy 06/24/2021   Endometrioma of ovary 11/06/2020   Pelvic pain 11/06/2020    Past Surgical History:  Procedure Laterality Date   NO PAST SURGERIES     ROBOTIC ASSISTED LAPAROSCOPIC OVARIAN CYSTECTOMY Right 11/06/2020   Procedure: XI ROBOTIC ASSISTED LAPAROSCOPIC OVARIAN CYSTECTOMY;  Surgeon: Will Bonnet, MD;  Location: [facility] OR[facility];  Service: Gynecology;  Laterality: Right;    Family History  Problem Relation Age of Onset   Diabetes Mother    Prostate cancer Maternal Grandfather    Cancer - Colon Paternal Grandfather    Endometriosis Neg Hx     Social History   Socioeconomic History   Marital status: Single    Spouse name: Not on file   Number of children: Not on file   Years of education: Not on file   Highest education level: Not on file  Occupational History   Not on file  Tobacco Use   Smoking status: Never   Smokeless  tobacco: Never  Vaping Use   Vaping Use: Never used  Substance and Sexual Activity   Alcohol use: Not Currently   Drug use: Not Currently   Sexual activity: Never    Birth control/protection: None, Pill  Other Topics Concern   Not on file  Social History Narrative   Not on file   Social Determinants of Health   Financial Resource Strain: Not on file  Food Insecurity: Not on file  Transportation Needs: Not on file  Physical Activity: Not on file  Stress: Not on file  Social Connections: Not on file  Intimate Partner Violence: Not on file     Current Outpatient Medications:    Norethindrone-Ethinyl Estradiol-Fe Biphas (LO LOESTRIN FE) 1 MG-10 MCG / 10 MCG tablet, Take 1 tablet by mouth daily., Disp: 84 tablet, Rfl: 0     ROS:  Review of Systems  Constitutional:  Negative for fatigue, fever and unexpected weight change.  Respiratory:  Negative for cough, shortness of breath and wheezing.   Cardiovascular:  Negative for chest pain, palpitations and leg swelling.  Gastrointestinal:  Negative for blood in stool, constipation, diarrhea, nausea and vomiting.  Endocrine: Negative for cold intolerance, heat intolerance and polyuria.  Genitourinary:  Negative for dyspareunia, dysuria, flank pain, frequency, genital sores, hematuria, menstrual problem, pelvic pain, urgency, vaginal bleeding, vaginal discharge and vaginal pain.  Musculoskeletal:  Negative for back pain, joint swelling  and myalgias.  Skin:  Negative for rash.  Neurological:  Negative for dizziness, syncope, light-headedness, numbness and headaches.  Hematological:  Negative for adenopathy.  Psychiatric/Behavioral:  Negative for agitation, confusion, sleep disturbance and suicidal ideas. The patient is not nervous/anxious.    BREAST: No symptoms   Objective: There were no vitals taken for this visit.   Physical Exam Constitutional:      Appearance: She is well-developed.  Genitourinary:     Vulva normal.      Right Labia: No rash, tenderness or lesions.    Left Labia: No tenderness, lesions or rash.    No vaginal discharge, erythema or tenderness.      Right Adnexa: not tender and no mass present.    Left Adnexa: not tender and no mass present.    No cervical friability or polyp.     Uterus is not enlarged or tender.  Breasts:    Right: No mass, nipple discharge, skin change or tenderness.     Left: No mass, nipple discharge, skin change or tenderness.  Neck:     Thyroid: No thyromegaly.  Cardiovascular:     Rate and Rhythm: Normal rate and regular rhythm.     Heart sounds: Normal heart sounds. No murmur heard. Pulmonary:     Effort: Pulmonary effort is normal.     Breath sounds: Normal breath sounds.  Abdominal:     Palpations: Abdomen is soft.     Tenderness: There is no abdominal tenderness. There is no guarding or rebound.  Musculoskeletal:        General: Normal range of motion.     Cervical back: Normal range of motion.  Lymphadenopathy:     Cervical: No cervical adenopathy.  Neurological:     General: No focal deficit present.     Mental Status: She is alert and oriented to person, place, and time.     Cranial Nerves: No cranial nerve deficit.  Skin:    General: Skin is warm and dry.  Psychiatric:        Mood and Affect: Mood normal.        Behavior: Behavior normal.        Thought Content: Thought content normal.        Judgment: Judgment normal.  Vitals reviewed.     Assessment/Plan: Encounter for annual routine gynecological examination  Cervical cancer screening - Plan: Cytology - PAP  Encounter for surveillance of contraceptive pills - Plan: Norethindrone-Ethinyl Estradiol-Fe Biphas (LO LOESTRIN FE) 1 MG-10 MCG / 10 MCG tablet; OCP RF. Doing well.   Endometrioma of ovary - Plan: Norethindrone-Ethinyl Estradiol-Fe Biphas (LO LOESTRIN FE) 1 MG-10 MCG / 10 MCG tablet  Endometriosis determined by laparoscopy - Plan: Norethindrone-Ethinyl Estradiol-Fe Biphas (LO  LOESTRIN FE) 1 MG-10 MCG / 10 MCG tablet  No orders of the defined types were placed in this encounter.            GYN counsel adequate intake of calcium and vitamin D, diet and exercise     F/U  No follow-ups on file.  Daphane Odekirk B. Evani Shrider, PA-C 07/06/2022 7:30 PM

## 2022-07-07 ENCOUNTER — Other Ambulatory Visit: Payer: Self-pay

## 2022-07-07 ENCOUNTER — Encounter: Payer: Self-pay | Admitting: Obstetrics and Gynecology

## 2022-07-07 ENCOUNTER — Ambulatory Visit (INDEPENDENT_AMBULATORY_CARE_PROVIDER_SITE_OTHER): Payer: BC Managed Care – PPO | Admitting: Obstetrics and Gynecology

## 2022-07-07 VITALS — BP 104/70 | Ht 65.5 in | Wt 140.0 lb

## 2022-07-07 DIAGNOSIS — Z3041 Encounter for surveillance of contraceptive pills: Secondary | ICD-10-CM

## 2022-07-07 DIAGNOSIS — Z01419 Encounter for gynecological examination (general) (routine) without abnormal findings: Secondary | ICD-10-CM

## 2022-07-07 DIAGNOSIS — N80129 Deep endometriosis of ovary, unspecified ovary: Secondary | ICD-10-CM

## 2022-07-07 MED ORDER — LO LOESTRIN FE 1 MG-10 MCG / 10 MCG PO TABS
1.0000 | ORAL_TABLET | Freq: Every day | ORAL | 3 refills | Status: DC
  Filled 2022-07-07 – 2022-08-22 (×2): qty 84, 84d supply, fill #0
  Filled 2022-11-12: qty 84, 84d supply, fill #1
  Filled 2023-02-06: qty 84, 84d supply, fill #2
  Filled 2023-04-27: qty 84, 84d supply, fill #3

## 2022-07-07 NOTE — Patient Instructions (Signed)
I value your feedback and you entrusting us with your care. If you get a [facility] patient survey, I would appreciate you taking the time to let us know about your experience today. Thank you! ? ? ?

## 2022-08-22 ENCOUNTER — Other Ambulatory Visit: Payer: Self-pay

## 2022-11-12 ENCOUNTER — Other Ambulatory Visit: Payer: Self-pay

## 2023-02-06 ENCOUNTER — Other Ambulatory Visit: Payer: Self-pay

## 2023-04-27 ENCOUNTER — Other Ambulatory Visit: Payer: Self-pay

## 2023-07-24 ENCOUNTER — Other Ambulatory Visit: Payer: Self-pay

## 2023-07-24 ENCOUNTER — Other Ambulatory Visit (HOSPITAL_COMMUNITY): Payer: Self-pay

## 2023-07-24 ENCOUNTER — Other Ambulatory Visit: Payer: Self-pay | Admitting: Obstetrics and Gynecology

## 2023-07-24 DIAGNOSIS — N80129 Deep endometriosis of ovary, unspecified ovary: Secondary | ICD-10-CM

## 2023-07-24 DIAGNOSIS — Z3041 Encounter for surveillance of contraceptive pills: Secondary | ICD-10-CM

## 2023-07-24 MED ORDER — LO LOESTRIN FE 1 MG-10 MCG / 10 MCG PO TABS: 1.0000 | ORAL_TABLET | Freq: Every day | ORAL | 0 refills | Status: DC

## 2023-07-24 MED FILL — Norethin-Eth Estradiol-Fe Tab 1 MG-10 MCG (24)/10 MCG (2): ORAL | 84 days supply | Qty: 84 | Fill #0 | Status: CN

## 2023-08-12 NOTE — Progress Notes (Unsigned)
 PCP:  Geradine Girt, MD   No chief complaint on file.    HPI:      Ms. Leslie Russell is a 23 y.o. G0P0000 whose LMP was No LMP recorded., presents today for her annual examination.  Her menses are regular every 28-30 days, lasting 3-4 days.  Dysmenorrhea much improved with OCPs/endometrioma removal. Takes NSAIDs prn with sx relief.  She does not have intermenstrual bleeding. Has some mood changes with period/if late with pill. Did OTC supplement in past that helped.  Hx of complex RTO cyst/process 5/22 with RT lap ovarian cystectomy; path showed endometrioma. Normal Gyn u/s f/u 11/22. On OCPs for endometriosis.   Sex activity: never sexually active Last Pap: 06/24/21  Results were no abnormalities Hx of STDs: none  There is no FH of breast cancer. There is no FH of ovarian cancer. The patient does not do self-breast exams.  Tobacco use: The patient denies current or previous tobacco use. Alcohol use: none No drug use.  Exercise: min active  She does get adequate calcium but not Vitamin D in her diet. Gardasil completed  Patient Active Problem List   Diagnosis Date Noted   Endometriosis determined by laparoscopy 06/24/2021   Endometrioma of ovary 11/06/2020   Pelvic pain 11/06/2020    Past Surgical History:  Procedure Laterality Date   ROBOTIC ASSISTED LAPAROSCOPIC OVARIAN CYSTECTOMY Right 11/06/2020   Procedure: XI ROBOTIC ASSISTED LAPAROSCOPIC OVARIAN CYSTECTOMY;  Surgeon: Conard Novak, MD;  Location: [facility] OR[facility];  Service: Gynecology;  Laterality: Right;    Family History  Problem Relation Age of Onset   Diabetes Mother    Prostate cancer Maternal Grandfather    Cancer - Colon Paternal Grandfather    Endometriosis Neg Hx     Social History   Socioeconomic History   Marital status: Single    Spouse name: Not on file   Number of children: Not on file   Years of education: Not on file   Highest education level: Not on file  Occupational History    Not on file  Tobacco Use   Smoking status: Never   Smokeless tobacco: Never  Vaping Use   Vaping status: Never Used  Substance and Sexual Activity   Alcohol use: Not Currently   Drug use: Not Currently   Sexual activity: Never    Birth control/protection: None, Pill  Other Topics Concern   Not on file  Social History Narrative   Not on file   Social Drivers of Health   Financial Resource Strain: Not on file  Food Insecurity: No Food Insecurity (10/18/2021)   Received from [facility]   Hunger Vital Sign    Worried About Running Out of Food in the Last Year: Never true    Ran Out of Food in the Last Year: Never true  Transportation Needs: Not on file  Physical Activity: Not on file  Stress: Not on file  Social Connections: Not on file  Intimate Partner Violence: Not on file     Current Outpatient Medications:    Norethindrone-Ethinyl Estradiol-Fe Biphas (LO LOESTRIN FE) 1 MG-10 MCG / 10 MCG tablet, Take 1 tablet by mouth daily., Disp: 84 tablet, Rfl: 0     ROS:  Review of Systems  Constitutional:  Negative for fatigue, fever and unexpected weight change.  Respiratory:  Negative for cough, shortness of breath and wheezing.   Cardiovascular:  Negative for chest pain, palpitations and leg swelling.  Gastrointestinal:  Negative  for blood in stool, constipation, diarrhea, nausea and vomiting.  Endocrine: Negative for cold intolerance, heat intolerance and polyuria.  Genitourinary:  Negative for dyspareunia, dysuria, flank pain, frequency, genital sores, hematuria, menstrual problem, pelvic pain, urgency, vaginal bleeding, vaginal discharge and vaginal pain.  Musculoskeletal:  Negative for back pain, joint swelling and myalgias.  Skin:  Negative for rash.  Neurological:  Negative for dizziness, syncope, light-headedness, numbness and headaches.  Hematological:  Negative for adenopathy.  Psychiatric/Behavioral:  Negative for agitation, confusion,  sleep disturbance and suicidal ideas. The patient is not nervous/anxious.    BREAST: No symptoms   Objective: There were no vitals taken for this visit.   Physical Exam Constitutional:      Appearance: She is well-developed.  Genitourinary:  Breasts:    Right: No mass, nipple discharge, skin change or tenderness.     Left: No mass, nipple discharge, skin change or tenderness.  Neck:     Thyroid: No thyromegaly.  Cardiovascular:     Rate and Rhythm: Normal rate and regular rhythm.     Heart sounds: Normal heart sounds. No murmur heard. Pulmonary:     Effort: Pulmonary effort is normal.     Breath sounds: Normal breath sounds.  Abdominal:     Palpations: Abdomen is soft.     Tenderness: There is no abdominal tenderness. There is no guarding.  Musculoskeletal:        General: Normal range of motion.     Cervical back: Normal range of motion.  Neurological:     Mental Status: She is alert and oriented to person, place, and time.     Cranial Nerves: No cranial nerve deficit.  Psychiatric:        Behavior: Behavior normal.  Vitals reviewed.   GYN EXAM DEFERRED SINCE PT NEVER SEXUALLY ACTIVE/ HAD NEG EXAM LAST YR  Assessment/Plan: Encounter for annual routine gynecological examination  Encounter for surveillance of contraceptive pills - Plan: Norethindrone-Ethinyl Estradiol-Fe Biphas (LO LOESTRIN FE) 1 MG-10 MCG / 10 MCG tablet; OCP RF. Doing well. F/u prn.   Endometrioma of ovary - Plan: Norethindrone-Ethinyl Estradiol-Fe Biphas (LO LOESTRIN FE) 1 MG-10 MCG / 10 MCG tablet   No orders of the defined types were placed in this encounter.            GYN counsel adequate intake of calcium and vitamin D, diet and exercise     F/U  No follow-ups on file.  Reola Buckles B. Kimberl Vig, PA-C 08/12/2023 5:33 PM

## 2023-08-13 ENCOUNTER — Other Ambulatory Visit: Payer: Self-pay

## 2023-08-13 ENCOUNTER — Encounter: Payer: Self-pay | Admitting: Obstetrics and Gynecology

## 2023-08-13 ENCOUNTER — Ambulatory Visit (INDEPENDENT_AMBULATORY_CARE_PROVIDER_SITE_OTHER): Payer: Self-pay | Admitting: Obstetrics and Gynecology

## 2023-08-13 VITALS — BP 98/64 | Ht 65.5 in | Wt 137.0 lb

## 2023-08-13 DIAGNOSIS — Z3041 Encounter for surveillance of contraceptive pills: Secondary | ICD-10-CM

## 2023-08-13 DIAGNOSIS — Z01419 Encounter for gynecological examination (general) (routine) without abnormal findings: Secondary | ICD-10-CM

## 2023-08-13 DIAGNOSIS — N80129 Deep endometriosis of ovary, unspecified ovary: Secondary | ICD-10-CM

## 2023-08-13 DIAGNOSIS — N809 Endometriosis, unspecified: Secondary | ICD-10-CM

## 2023-08-13 MED ORDER — LO LOESTRIN FE 1 MG-10 MCG / 10 MCG PO TABS
1.0000 | ORAL_TABLET | Freq: Every day | ORAL | 3 refills | Status: AC
  Filled 2023-08-13 – 2023-10-13 (×2): qty 84, 84d supply, fill #0

## 2023-08-13 NOTE — Patient Instructions (Signed)
 I value your feedback and you entrusting [place] with your care. If you get a [facility] patient survey, I would appreciate you taking the time to let us know about your experience today. Thank you! ? ? ?

## 2023-09-08 ENCOUNTER — Other Ambulatory Visit: Payer: Self-pay | Admitting: Obstetrics and Gynecology

## 2023-09-08 ENCOUNTER — Ambulatory Visit

## 2023-09-08 DIAGNOSIS — N80129 Deep endometriosis of ovary, unspecified ovary: Secondary | ICD-10-CM

## 2023-09-08 DIAGNOSIS — N809 Endometriosis, unspecified: Secondary | ICD-10-CM

## 2023-09-08 DIAGNOSIS — Z01419 Encounter for gynecological examination (general) (routine) without abnormal findings: Secondary | ICD-10-CM

## 2023-09-08 DIAGNOSIS — Z3041 Encounter for surveillance of contraceptive pills: Secondary | ICD-10-CM

## 2023-09-10 ENCOUNTER — Encounter: Payer: Self-pay | Admitting: Obstetrics and Gynecology

## 2023-09-10 ENCOUNTER — Ambulatory Visit (INDEPENDENT_AMBULATORY_CARE_PROVIDER_SITE_OTHER)

## 2023-09-10 ENCOUNTER — Other Ambulatory Visit: Payer: Self-pay | Admitting: Obstetrics and Gynecology

## 2023-09-10 DIAGNOSIS — N80129 Deep endometriosis of ovary, unspecified ovary: Secondary | ICD-10-CM

## 2023-09-10 DIAGNOSIS — N809 Endometriosis, unspecified: Secondary | ICD-10-CM

## 2023-10-13 ENCOUNTER — Other Ambulatory Visit: Payer: Self-pay

## 2024-01-06 ENCOUNTER — Other Ambulatory Visit: Payer: Self-pay

## 2024-01-07 ENCOUNTER — Other Ambulatory Visit: Payer: Self-pay

## 2024-01-26 ENCOUNTER — Ambulatory Visit
Admission: RE | Admit: 2024-01-26 | Discharge: 2024-01-26 | Disposition: A | Discharge status: Home / Self Care | Source: Ambulatory Visit | Attending: Physician Assistant | Admitting: Physician Assistant

## 2024-01-26 VITALS — BP 124/73 | HR 74 | Temp 98.5°F | Resp 18

## 2024-01-26 DIAGNOSIS — K120 Recurrent oral aphthae: Secondary | ICD-10-CM | POA: Diagnosis not present

## 2024-01-26 DIAGNOSIS — J029 Acute pharyngitis, unspecified: Secondary | ICD-10-CM

## 2024-01-26 DIAGNOSIS — J069 Acute upper respiratory infection, unspecified: Secondary | ICD-10-CM

## 2024-01-26 DIAGNOSIS — R051 Acute cough: Secondary | ICD-10-CM

## 2024-01-26 LAB — GROUP A STREP BY PCR: Group A Strep by PCR: NOT DETECTED

## 2024-01-26 LAB — SARS CORONAVIRUS 2 BY RT PCR: SARS Coronavirus 2 by RT PCR: NEGATIVE

## 2024-01-26 MED ORDER — LIDOCAINE VISCOUS HCL 2 % MT SOLN: 15.0000 mL | OROMUCOSAL | 0 refills | Status: AC | PRN

## 2024-01-26 MED ORDER — PROMETHAZINE-DM 6.25-15 MG/5ML PO SYRP: 5.0000 mL | ORAL_SOLUTION | Freq: Four times a day (QID) | ORAL | 0 refills | Status: AC | PRN

## 2024-01-26 NOTE — ED Triage Notes (Signed)
 Pt presents with a sore throat and runny nose x 2 days. Pt has taken Advil  and NyQuil for her symptoms.

## 2024-01-26 NOTE — Discharge Instructions (Signed)
-  Negative COVID and strep.  URI/COLD SYMPTOMS: Your exam today is consistent with a viral illness. Antibiotics are not indicated at this time. Use medications as directed, including cough syrup, nasal saline, and decongestants. Your symptoms should improve over the next few days and resolve within 7-10 days. Increase rest and fluids. F/u if symptoms worsen or predominate such as sore throat, ear pain, productive cough, shortness of breath, or if you develop high fevers or worsening fatigue over the next several days.   

## 2024-01-26 NOTE — ED Provider Notes (Signed)
 [facility]    CSN: [id number] Arrival date & time: 01/26/24  0941      History   Chief Complaint Chief Complaint  Patient presents with   Sore Throat    Entered by patient    HPI Leslie Russell is a 23 y.o. female presenting for fatigue, cough, congestion, sore throat and runny nose x 2 days.   Denies fever, ear pain, sinus pain, chest pain, wheezing, shortness of breath, abdominal pain, vomiting or diarrhea.   Patient has been taking over-the-counter Nyquil. No other complaints.  HPI  Past Medical History:  Diagnosis Date   Endometrioma of ovary    Vaccine for human papilloma virus (HPV) types 6, 11, 16, and 18 administered     Patient Active Problem List   Diagnosis Date Noted   Endometriosis determined by laparoscopy 06/24/2021   Endometrioma of ovary 11/06/2020   Pelvic pain 11/06/2020    Past Surgical History:  Procedure Laterality Date   ROBOTIC ASSISTED LAPAROSCOPIC OVARIAN CYSTECTOMY Right 11/06/2020   Procedure: XI ROBOTIC ASSISTED LAPAROSCOPIC OVARIAN CYSTECTOMY;  Surgeon: Leonce Garnette BIRCH, MD;  Location: [facility] OR[facility];  Service: Gynecology;  Laterality: Right;    OB History     Gravida  0   Para  0   Term  0   Preterm  0   AB  0   Living  0      SAB  0   IAB  0   Ectopic  0   Multiple  0   Live Births  0            Home Medications    Prior to Admission medications   Medication Sig Start Date End Date Taking? Authorizing Provider  lidocaine  (XYLOCAINE ) 2 % solution Use as directed 15 mLs in the mouth or throat every 3 (three) hours as needed for mouth pain (swish and spit). 01/26/24  Yes Arvis Jolan NOVAK, PA-C  promethazine -dextromethorphan (PROMETHAZINE -DM) 6.25-15 MG/5ML syrup Take 5 mLs by mouth 4 (four) times daily as needed. 01/26/24  Yes Arvis Jolan NOVAK, PA-C  Norethindrone-Ethinyl Estradiol -Fe Biphas (LO LOESTRIN FE ) 1 MG-10 MCG / 10 MCG tablet Take 1 tablet by mouth daily. 08/13/23 01/07/24  Copland, Bernarda NOVAK, PA-C     Family History Family History  Problem Relation Age of Onset   Diabetes Mother    Prostate cancer Maternal Grandfather    Cancer - Colon Paternal Grandfather    Endometriosis Neg Hx     Social History Social History   Tobacco Use   Smoking status: Never   Smokeless tobacco: Never  Vaping Use   Vaping status: Never Used  Substance Use Topics   Alcohol use: Not Currently   Drug use: Not Currently     Allergies   Patient has no known allergies.   Review of Systems Review of Systems  Constitutional:  Positive for fatigue. Negative for chills, diaphoresis and fever.  HENT:  Positive for congestion, rhinorrhea and sore throat. Negative for ear pain, sinus pressure and sinus pain.   Respiratory:  Positive for cough. Negative for shortness of breath.   Gastrointestinal:  Negative for abdominal pain, nausea and vomiting.  Musculoskeletal:  Negative for arthralgias and myalgias.  Skin:  Negative for rash.  Neurological:  Negative for weakness and headaches.  Hematological:  Negative for adenopathy.     Physical Exam Triage Vital Signs ED Triage Vitals  Encounter Vitals Group     BP 01/26/24 0952 124/73  Girls Systolic BP Percentile --      Girls Diastolic BP Percentile --      Boys Systolic BP Percentile --      Boys Diastolic BP Percentile --      Pulse Rate 01/26/24 0952 74     Resp 01/26/24 0952 18     Temp 01/26/24 0952 98.5 F (36.9 C)     Temp Source 01/26/24 0952 Oral     SpO2 01/26/24 0952 98 %     Weight --      Height --      Head Circumference --      Peak Flow --      Pain Score 01/26/24 0950 4     Pain Loc --      Pain Education --      Exclude from Growth Chart --    No data found.  Updated Vital Signs BP 124/73 (BP Location: Right Arm)   Pulse 74   Temp 98.5 F (36.9 C) (Oral)   Resp 18   SpO2 98%     Physical Exam Vitals and nursing note reviewed.  Constitutional:      General: She is not in acute distress.    Appearance:  Normal appearance. She is not ill-appearing or toxic-appearing.  HENT:     Head: Normocephalic and atraumatic.     Right Ear: Tympanic membrane, ear canal and external ear normal.     Left Ear: Tympanic membrane, ear canal and external ear normal.     Nose: Congestion present.     Mouth/Throat:     Mouth: Mucous membranes are moist.     Pharynx: Oropharynx is clear. Posterior oropharyngeal erythema present.     Comments: Small aphthous ulcer right posterior pharynx Eyes:     General: No scleral icterus.       Right eye: No discharge.        Left eye: No discharge.     Conjunctiva/sclera: Conjunctivae normal.  Cardiovascular:     Rate and Rhythm: Normal rate and regular rhythm.     Heart sounds: Normal heart sounds.  Pulmonary:     Effort: Pulmonary effort is normal. No respiratory distress.     Breath sounds: Normal breath sounds.  Musculoskeletal:     Cervical back: Neck supple.  Skin:    General: Skin is dry.  Neurological:     General: No focal deficit present.     Mental Status: She is alert. Mental status is at baseline.     Motor: No weakness.     Gait: Gait normal.  Psychiatric:        Mood and Affect: Mood normal.        Behavior: Behavior normal.      UC Treatments / Results  Labs (all labs ordered are listed, but only abnormal results are displayed) Labs Reviewed  GROUP A STREP BY PCR  SARS CORONAVIRUS 2 BY RT PCR    EKG   Radiology No results found.  Procedures Procedures (including critical care time)  Medications Ordered in UC Medications - No data to display  Initial Impression / Assessment and Plan / UC Course  I have reviewed the triage vital signs and the nursing notes.  Pertinent labs & imaging results that were available during my care of the patient were reviewed by me and considered in my medical decision making (see chart for details).   23 year old female presents for 2-day history of sore throat, cough and congestion.  Reports  increased pain in throat today.  No fever.  Vitals are stable and normal.  Overall well-appearing.  No acute distress.  On exam has erythema posterior pharynx with an aphthous ulcer on the right side of the posterior pharynx and nasal congestion.  Chest is clear.  Heart regular rate and rhythm.  PCR COVID and strep testing obtained. Negative.   Viral URI. Supportive care. Sent viscous lidocaine  and promethazine  DM. Reviewed return precautions. Work note given.   Final Clinical Impressions(s) / UC Diagnoses   Final diagnoses:  Viral upper respiratory tract infection  Sore throat  Aphthous ulcer of mouth  Acute cough     Discharge Instructions      -Negative COVID and strep  URI/COLD SYMPTOMS: Your exam today is consistent with a viral illness. Antibiotics are not indicated at this time. Use medications as directed, including cough syrup, nasal saline, and decongestants. Your symptoms should improve over the next few days and resolve within 7-10 days. Increase rest and fluids. F/u if symptoms worsen or predominate such as sore throat, ear pain, productive cough, shortness of breath, or if you develop high fevers or worsening fatigue over the next several days.       ED Prescriptions     Medication Sig Dispense Auth. Provider   lidocaine  (XYLOCAINE ) 2 % solution Use as directed 15 mLs in the mouth or throat every 3 (three) hours as needed for mouth pain (swish and spit). 100 mL Arvis Huxley B, PA-C   promethazine -dextromethorphan (PROMETHAZINE -DM) 6.25-15 MG/5ML syrup Take 5 mLs by mouth 4 (four) times daily as needed. 118 mL Arvis Huxley NOVAK, PA-C      PDMP not reviewed this encounter.   Arvis Huxley NOVAK, PA-C 01/26/24 1049
